# Patient Record
Sex: Female | Born: 1960 | Race: White | Hispanic: No | Marital: Married
Health system: Southern US, Community
[De-identification: ages and names within clinical notes are randomized; demographics above are authoritative.]

## PROBLEM LIST (undated history)

## (undated) DIAGNOSIS — E785 Hyperlipidemia, unspecified: Secondary | ICD-10-CM

## (undated) DIAGNOSIS — I341 Nonrheumatic mitral (valve) prolapse: Secondary | ICD-10-CM

---

## 2004-12-14 ENCOUNTER — Emergency Department: Payer: Self-pay | Admitting: Emergency Medicine

## 2005-01-03 ENCOUNTER — Emergency Department: Payer: Self-pay | Admitting: Emergency Medicine

## 2006-02-20 ENCOUNTER — Emergency Department: Payer: Self-pay | Admitting: Emergency Medicine

## 2007-07-10 ENCOUNTER — Emergency Department: Payer: Self-pay | Admitting: Emergency Medicine

## 2009-04-09 ENCOUNTER — Emergency Department: Payer: Self-pay | Admitting: Emergency Medicine

## 2010-03-11 ENCOUNTER — Ambulatory Visit: Payer: Self-pay | Admitting: Unknown Physician Specialty

## 2010-03-16 ENCOUNTER — Ambulatory Visit: Payer: Self-pay | Admitting: Unknown Physician Specialty

## 2010-03-22 ENCOUNTER — Ambulatory Visit: Payer: Self-pay | Admitting: Unknown Physician Specialty

## 2010-03-24 ENCOUNTER — Ambulatory Visit: Payer: Self-pay | Admitting: Unknown Physician Specialty

## 2011-04-01 IMAGING — CT CT ABDOMEN AND PELVIS WITHOUT AND WITH CONTRAST
1 of 4 series · 12 of 32 positions shown, 18 images · IV contrast (agent unspecified)
Comparison: none

REASON FOR EXAM: LLQ abd pain  eval diverticulosis or stone
COMMENTS:

PROCEDURE:     CT  - CT ABDOMEN / PELVIS  W/WO  - March 16, 2010  [DATE]
RESULT:
TECHNIQUE: CT of the abdomen and pelvis is performed. Noncontrast images and
images obtained following intravenous administration of 100 ml of Esovue-C58
iodinated intravenous contrast are obtained. Only limited delayed post
contrast images were obtained over the abdominal region. This may have been
secondary to technical oversight.

[Series 3: with · axial · 0.79mm/px · z∈[-448,-38]mm · 12 of 98 slices shown, 18 images]
[im 8/98  soft-tissue]
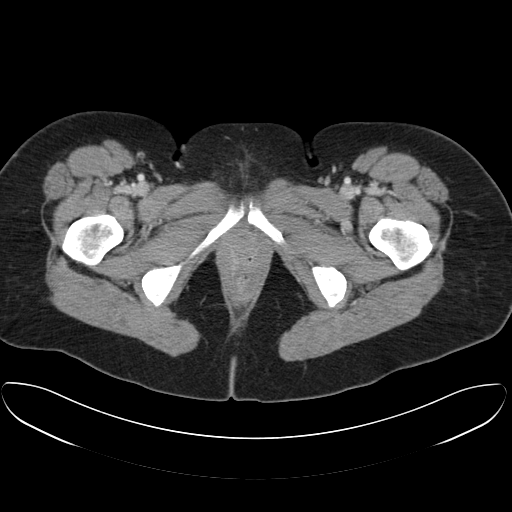
[im 8/98  bone]
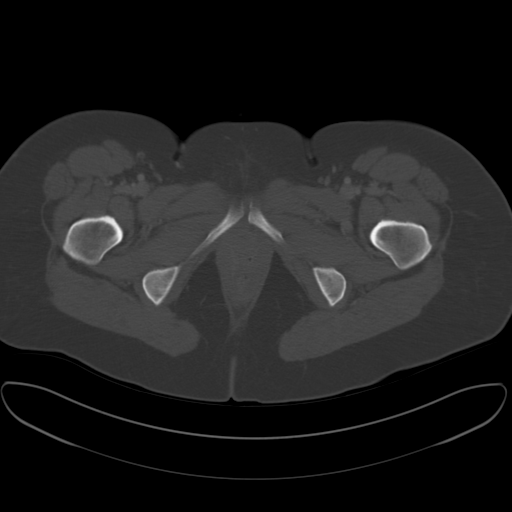
[im 15/98  soft-tissue]
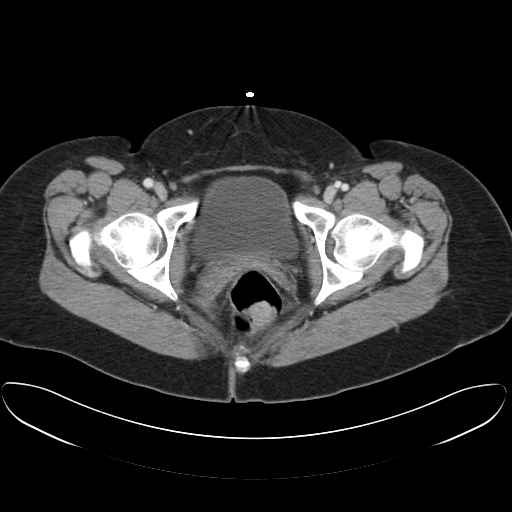
[im 23/98  soft-tissue]
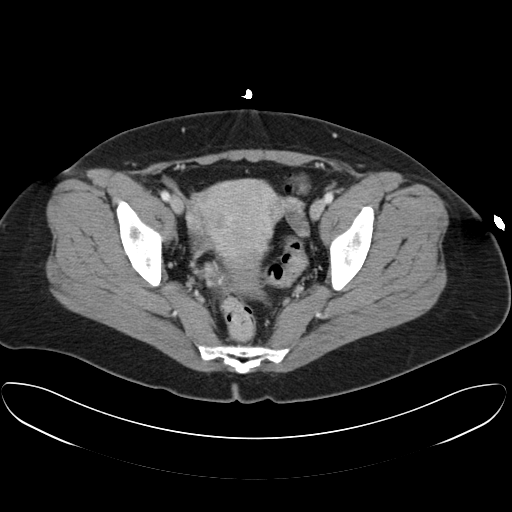
[im 30/98  soft-tissue]
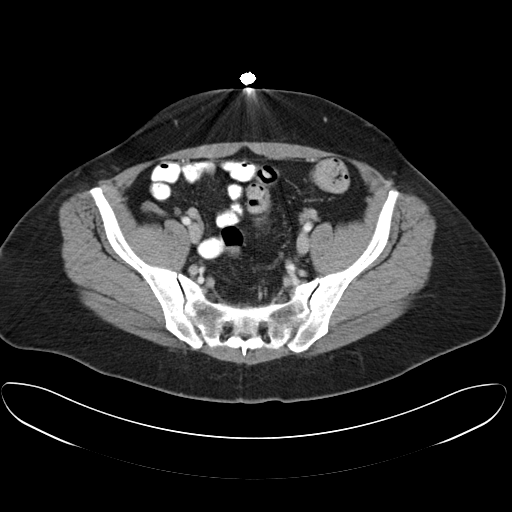
[im 38/98  soft-tissue]
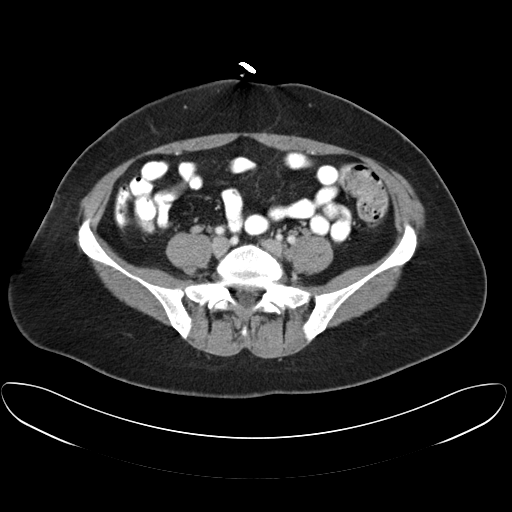
[im 45/98  soft-tissue]
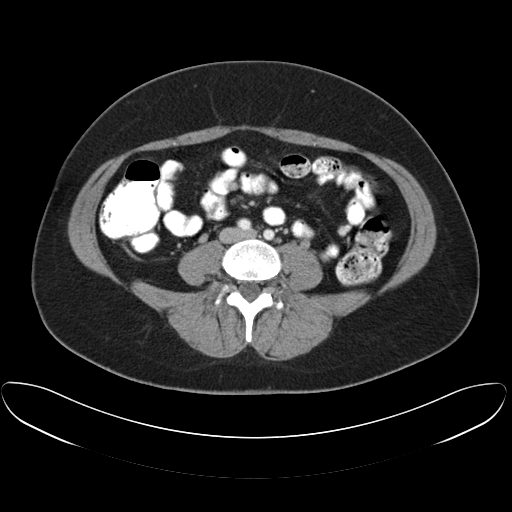
[im 53/98  soft-tissue]
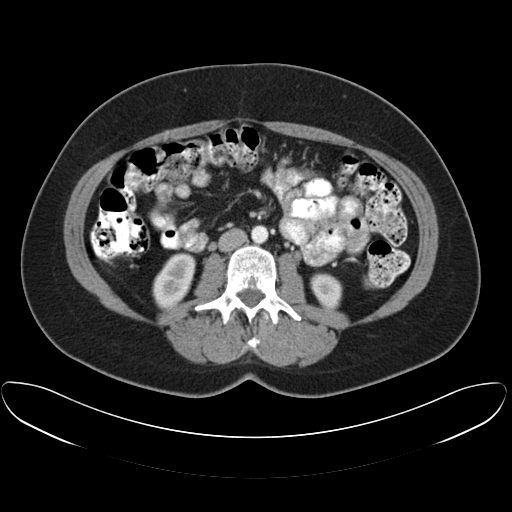
[im 60/98  soft-tissue]
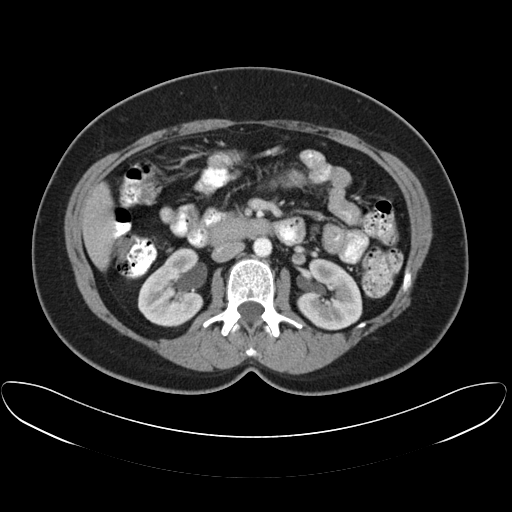
[im 68/98  soft-tissue]
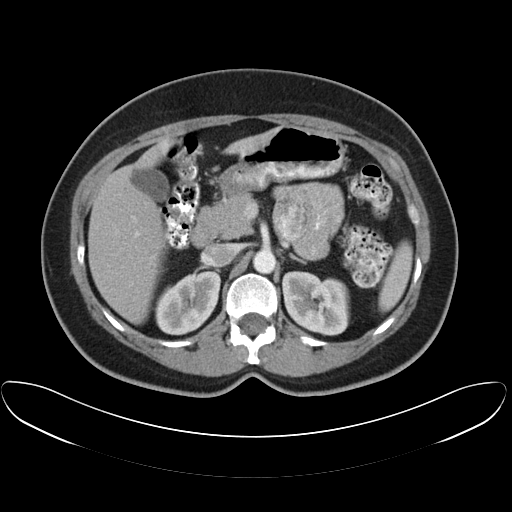
[im 68/98  lung]
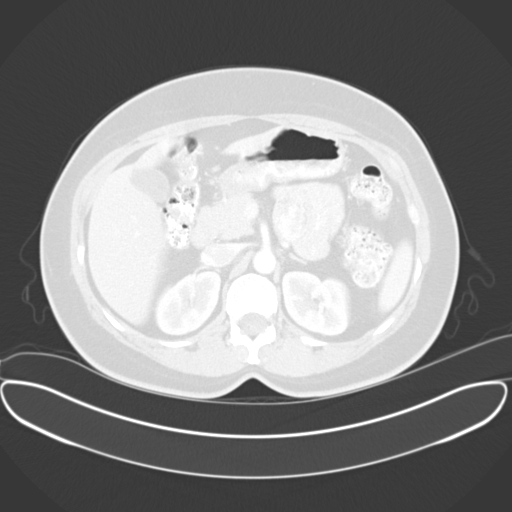
[im 68/98  bone]
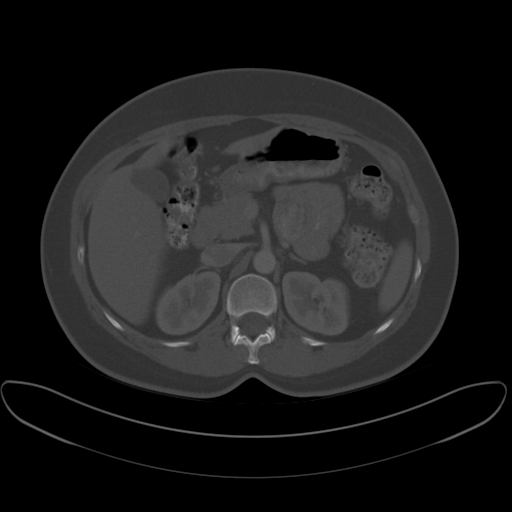
[im 75/98  soft-tissue]
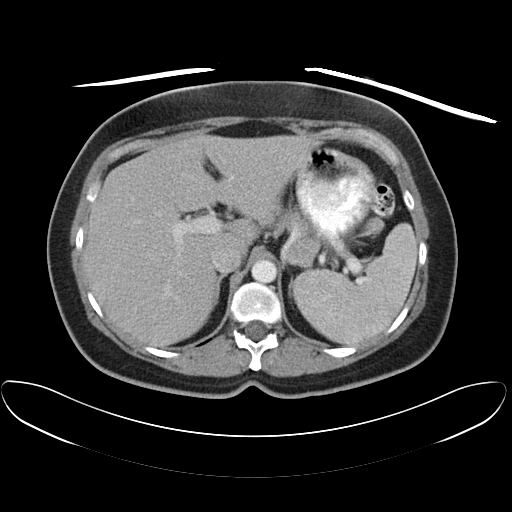
[im 75/98  lung]
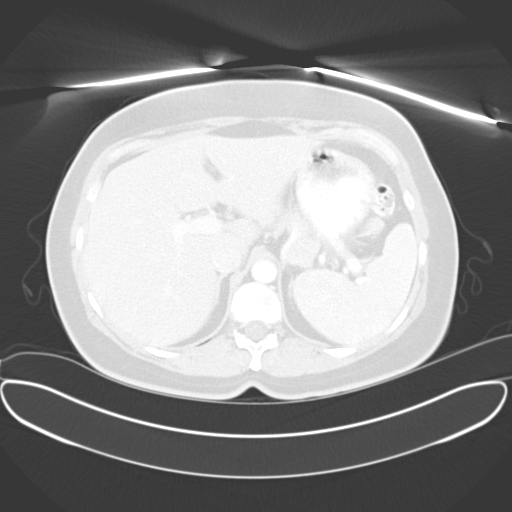
[im 83/98  soft-tissue]
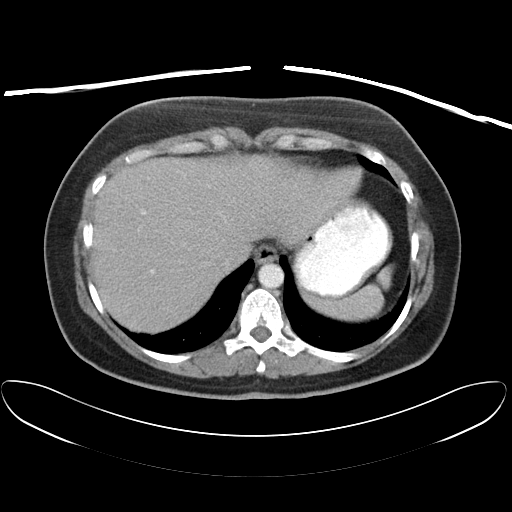
[im 83/98  lung]
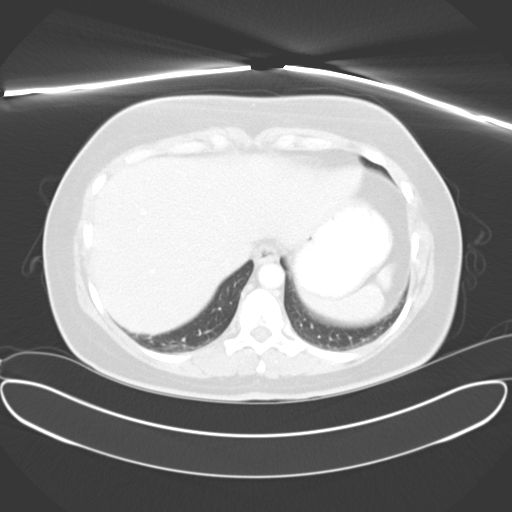
[im 90/98  soft-tissue]
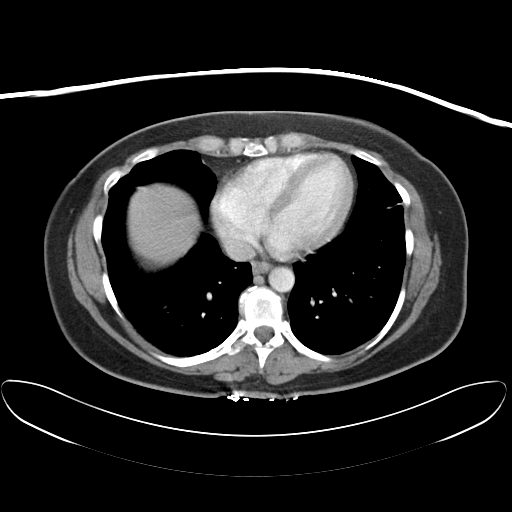
[im 90/98  lung]
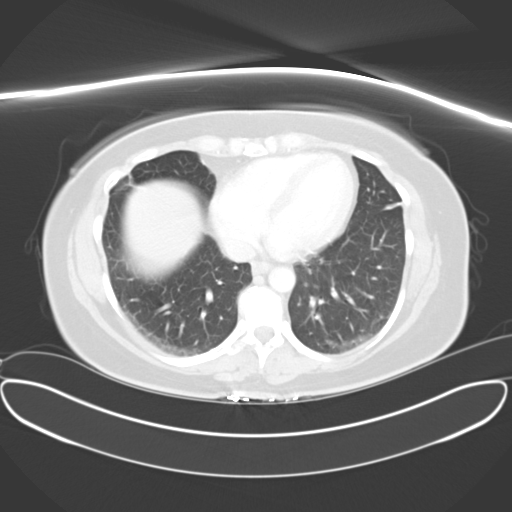

[12 of 32 positions shown; findings below may reference images not displayed]

FINDINGS: Dependent atelectasis is present in the lung bases. There is some
atelectasis in the right middle lobe and lingula. Linear fibrosis could give
a similar pattern. There is no underlying mass, edema, infiltrate or
effusion.

Noncontrast images show some scattered colonic diverticula in the sigmoid
region. No radiopaque gallstones are demonstrated. There is a nonobstructing
calculus or tiny parenchymal calcification in the right kidney on image #36.

Following intravenous administration of iodinated contrast, there appears to
be relatively homogeneous enhancement of the uterus with a few small areas
of decreased enhancement on image #74 which could be artifactual or which
could represent some small leiomyomas. The appendix is normal. There is no
acute inflammation. There is no free fluid or abnormal fluid collection. The
aorta and iliac arteries appear unremarkable. The kidneys, liver, spleen,
adrenal glands and pancreas are within normal limits. The bony structures
are unremarkable.
IMPRESSION: 1.     Minimal diverticulosis. No evidence of acute diverticulitis.
2.     No urinary obstructive changes are present. There is a tiny,
nonobstructing calculus possibly in the right kidney on image #36.

## 2012-12-16 ENCOUNTER — Emergency Department: Payer: Self-pay | Admitting: Emergency Medicine

## 2012-12-16 LAB — CBC
HCT: 41.1 % (ref 35.0–47.0)
MCH: 31.1 pg (ref 26.0–34.0)
MCHC: 33.8 g/dL (ref 32.0–36.0)
MCV: 92 fL (ref 80–100)
Platelet: 280 10*3/uL (ref 150–440)
RBC: 4.46 10*6/uL (ref 3.80–5.20)
RDW: 13 % (ref 11.5–14.5)
WBC: 9 10*3/uL (ref 3.6–11.0)

## 2012-12-16 LAB — URINALYSIS, COMPLETE
Glucose,UR: NEGATIVE mg/dL (ref 0–75)
Hyaline Cast: 4
Nitrite: NEGATIVE
Ph: 5 (ref 4.5–8.0)
Protein: 100
Specific Gravity: 1.025 (ref 1.003–1.030)
WBC UR: 10 /HPF (ref 0–5)

## 2012-12-16 LAB — COMPREHENSIVE METABOLIC PANEL
Albumin: 4 g/dL (ref 3.4–5.0)
BUN: 19 mg/dL — ABNORMAL HIGH (ref 7–18)
Calcium, Total: 8.9 mg/dL (ref 8.5–10.1)
Chloride: 110 mmol/L — ABNORMAL HIGH (ref 98–107)
Co2: 26 mmol/L (ref 21–32)
Creatinine: 0.87 mg/dL (ref 0.60–1.30)
EGFR (African American): >60
EGFR (Non-African Amer.): >60
Glucose: 119 mg/dL — ABNORMAL HIGH (ref 65–99)
Potassium: 3.1 mmol/L — ABNORMAL LOW (ref 3.5–5.1)
SGOT(AST): 27 U/L (ref 15–37)
SGPT (ALT): 24 U/L (ref 12–78)
Sodium: 144 mmol/L (ref 136–145)

## 2013-01-11 ENCOUNTER — Ambulatory Visit: Payer: Self-pay | Admitting: Gastroenterology

## 2013-01-16 LAB — PATHOLOGY REPORT

## 2014-03-28 ENCOUNTER — Ambulatory Visit: Payer: Self-pay | Admitting: Gastroenterology

## 2014-04-02 LAB — PATHOLOGY REPORT

## 2015-06-17 ENCOUNTER — Other Ambulatory Visit: Payer: Self-pay | Admitting: Family Medicine

## 2015-06-17 DIAGNOSIS — N390 Urinary tract infection, site not specified: Secondary | ICD-10-CM

## 2015-06-17 DIAGNOSIS — R319 Hematuria, unspecified: Principal | ICD-10-CM

## 2015-06-25 ENCOUNTER — Ambulatory Visit
Admission: RE | Admit: 2015-06-25 | Discharge: 2015-06-25 | Disposition: A | Discharge status: Home / Self Care | Payer: Managed Care, Other (non HMO) | Source: Ambulatory Visit | Attending: Family Medicine | Admitting: Family Medicine

## 2015-06-25 ENCOUNTER — Telehealth (HOSPITAL_COMMUNITY): Payer: Self-pay | Admitting: Radiology

## 2015-06-25 NOTE — Telephone Encounter (Deleted)
Laurey Arrow left msg for the patient advising this appt has been rescheduled to July 14 at 10am.

## 2015-06-25 NOTE — Telephone Encounter (Signed)
Laurey Arrow left msg for the patient advising this appt has been rescheduled to July 14 at 10am.

## 2015-07-02 ENCOUNTER — Ambulatory Visit
Admission: RE | Admit: 2015-07-02 | Discharge: 2015-07-02 | Disposition: A | Discharge status: Home / Self Care | Payer: Managed Care, Other (non HMO) | Source: Ambulatory Visit | Attending: Family Medicine | Admitting: Family Medicine

## 2015-07-02 DIAGNOSIS — R319 Hematuria, unspecified: Secondary | ICD-10-CM | POA: Diagnosis present

## 2015-07-02 DIAGNOSIS — N390 Urinary tract infection, site not specified: Secondary | ICD-10-CM

## 2015-07-02 DIAGNOSIS — K5730 Diverticulosis of large intestine without perforation or abscess without bleeding: Secondary | ICD-10-CM | POA: Diagnosis not present

## 2015-07-02 DIAGNOSIS — D259 Leiomyoma of uterus, unspecified: Secondary | ICD-10-CM | POA: Diagnosis not present

## 2015-07-02 HISTORY — DX: Nonrheumatic mitral (valve) prolapse: I34.1

## 2015-07-02 HISTORY — DX: Hyperlipidemia, unspecified: E78.5

## 2015-07-02 MED ORDER — IOHEXOL 300 MG/ML  SOLN
125.0000 mL | Freq: Once | INTRAMUSCULAR | Status: AC | PRN
  Administered 2015-07-02: 125 mL via INTRAVENOUS

## 2016-07-17 IMAGING — CT CT ABD-PEL WO/W CM
2 of 10 series · 10 of 46 positions shown, 16 images · IV contrast (omnipaque)
Comparison: CT of the abdomen and pelvis 03/16/2010.

CLINICAL DATA: 53-year-old female with history of microscopic
hematuria found incidentally during routine yearly physical
examination. Currently asymptomatic. No history of kidney stones.

EXAM:
CT ABDOMEN AND PELVIS WITHOUT AND WITH CONTRAST
TECHNIQUE: Multidetector CT imaging of the abdomen and pelvis was performed
following the standard protocol before and following the bolus
administration of intravenous contrast.
CONTRAST:  125mL OMNIPAQUE IOHEXOL 300 MG/ML  SOLN

[Series 4: hematuria > 45 with · axial · 0.73mm/px · z∈[-925,-560]mm · 8 of 95 slices shown, 13 images]
[im 11/95  soft-tissue]
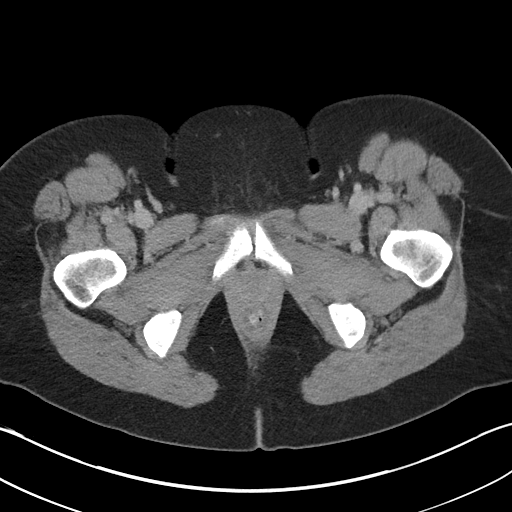
[im 11/95  bone]
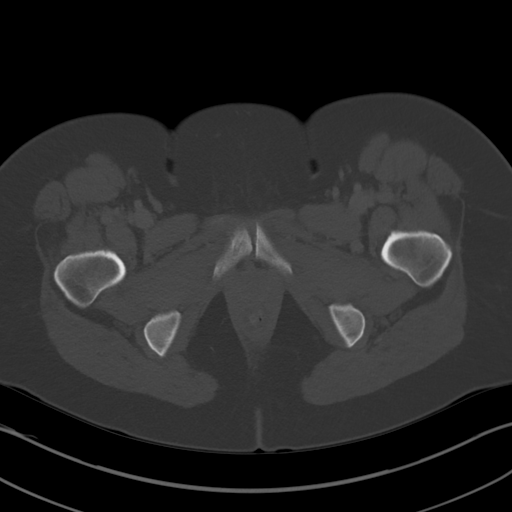
[im 21/95  soft-tissue]
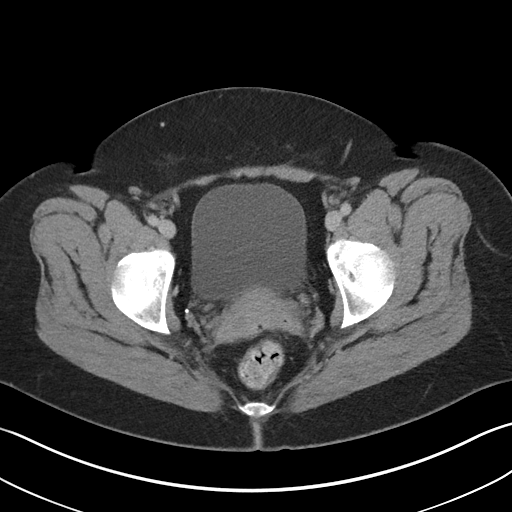
[im 32/95  soft-tissue]
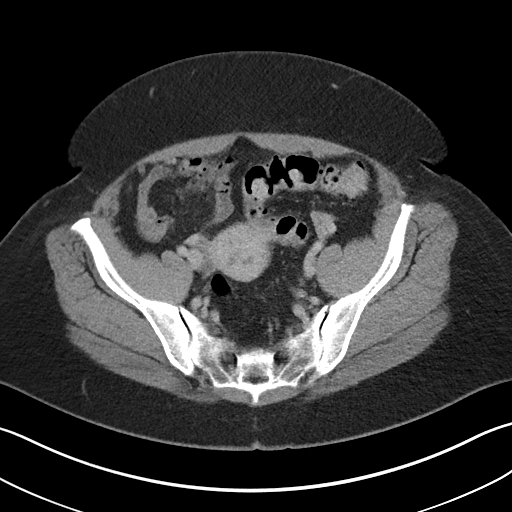
[im 42/95  soft-tissue]
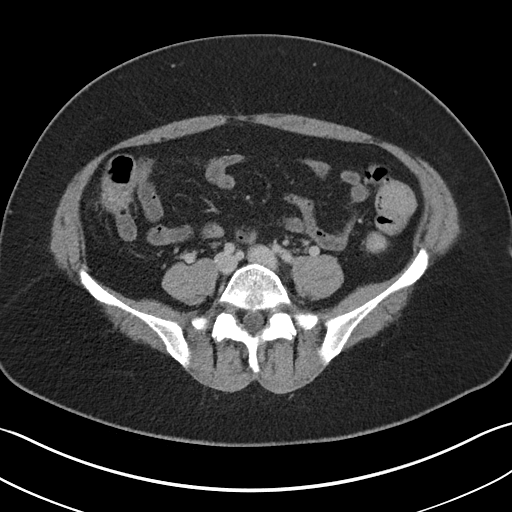
[im 53/95  soft-tissue]
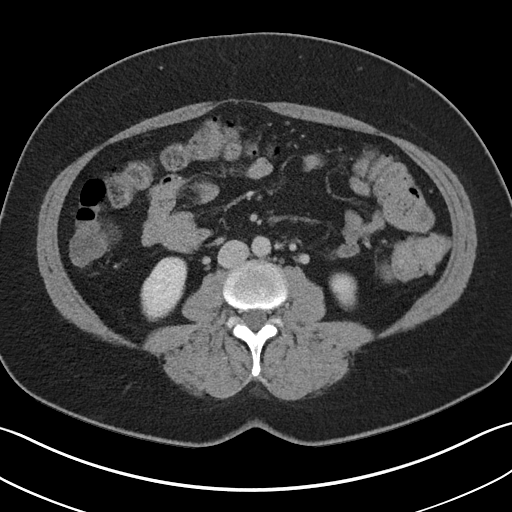
[im 53/95  lung]
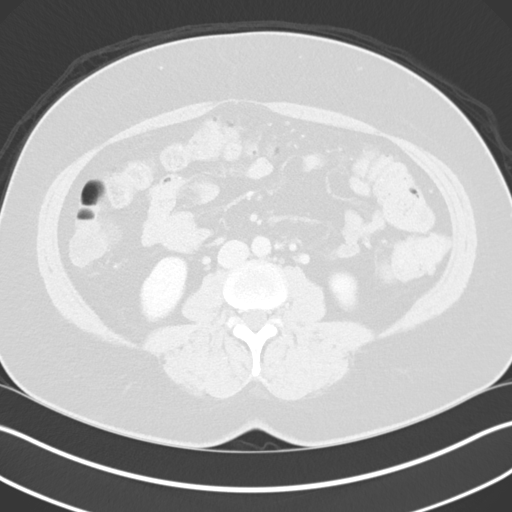
[im 63/95  soft-tissue]
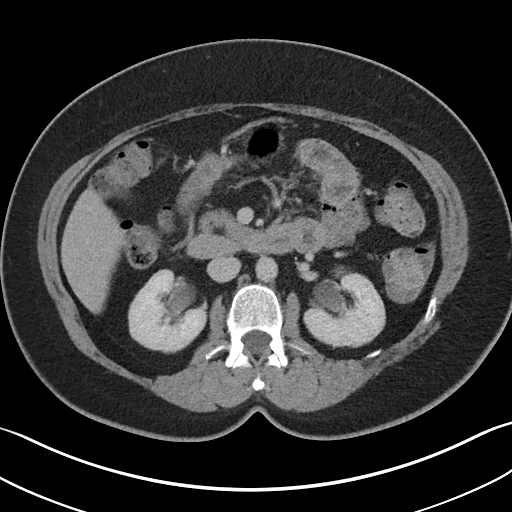
[im 63/95  lung]
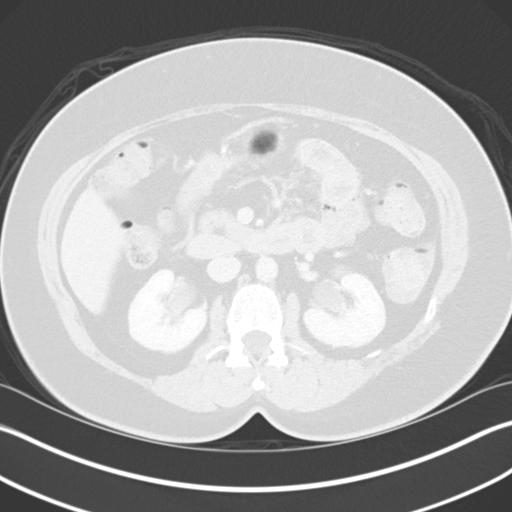
[im 74/95  soft-tissue]
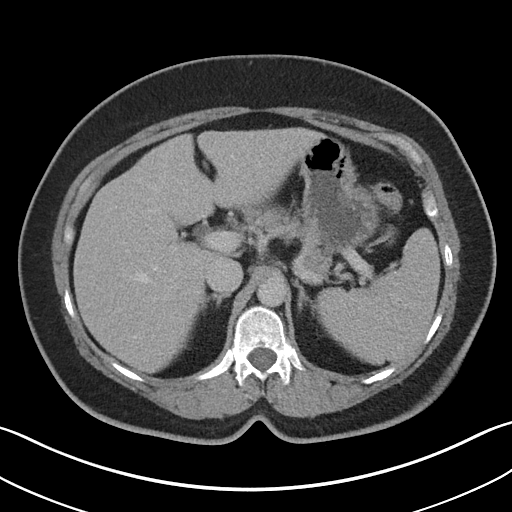
[im 74/95  lung]
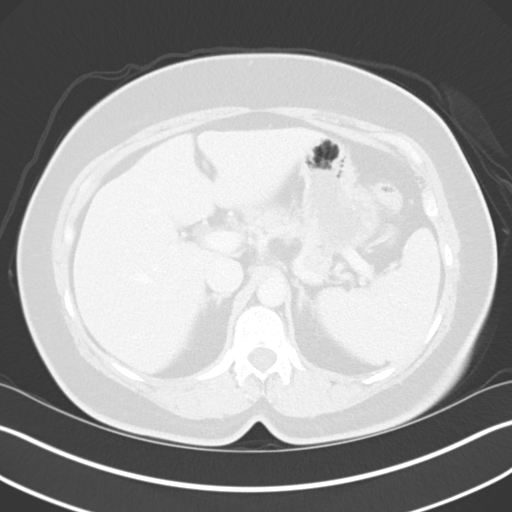
[im 84/95  soft-tissue]
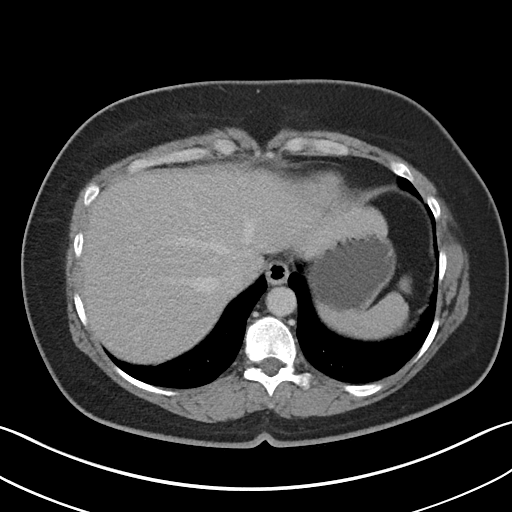
[im 84/95  lung]
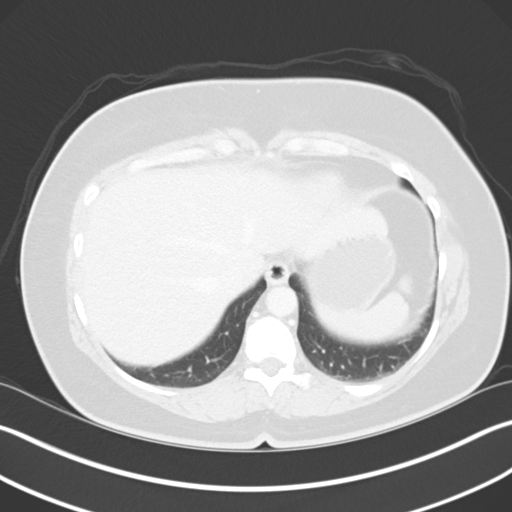

[Series 7: cor hematuria > 45 wo · coronal · 0.75mm/px · 2 of 161 slices shown, 3 images]
[im 54/161  soft-tissue]
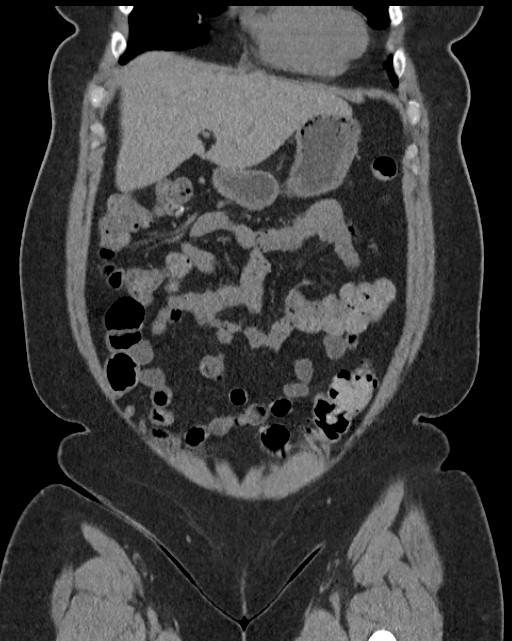
[im 54/161  bone]
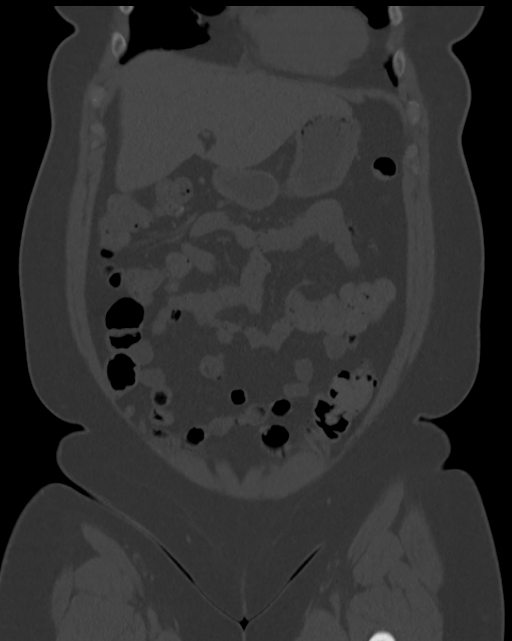
[im 107/161  soft-tissue]
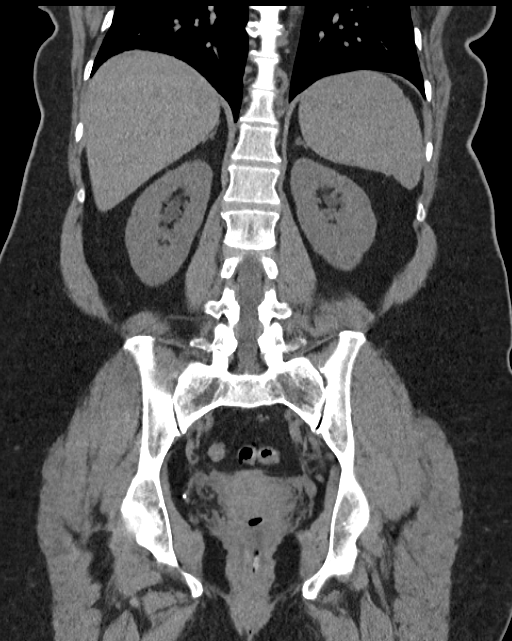

[10 of 46 positions shown; findings below may reference images not displayed]

FINDINGS: Lower chest: Linear scarring in the inferior segment of the lingula
and in the right middle lobe.

Hepatobiliary: No cystic or solid hepatic lesions. No intra or
extrahepatic biliary ductal dilatation. Gallbladder is normal in
appearance.

Pancreas: No pancreatic mass. No pancreatic ductal dilatation. No
pancreatic or peripancreatic fluid or inflammatory changes.

Spleen: Unremarkable.

Adrenals/Urinary Tract: Precontrast images demonstrate no
calcifications within the collecting system of either kidney, along
the course of either ureter, or within the lumen of the urinary
bladder. Post contrast images demonstrate no focal solid renal
lesions. Post contrast delayed images demonstrate no definite
filling defects within the collecting system of either kidney, along
the course of either ureter, or within the lumen of the urinary
bladder to strongly suggest the presence of a urothelial neoplasm.
Urinary bladder is normal in appearance. Bilateral adrenal glands
are normal in appearance.

Stomach/Bowel: Normal appearance of the stomach. No pathologic
dilatation of small bowel or colon. Numerous colonic diverticulae
are noted, without surrounding inflammatory changes to suggest an
acute diverticulitis at this time. Normal appendix.

Vascular/Lymphatic: Atherosclerosis throughout the abdominal and
pelvic vasculature, without evidence of aneurysm or dissection. No
lymphadenopathy noted in the abdomen or pelvis.

Reproductive: Mild enlargement and heterogeneous throughout the
uterus, suggestive of a fibroid uterus. Multiple small lesions are
noted throughout the uterus, largest of which measures up to 1.4 cm
in the right side of the uterine fundus. Ovaries are normal in
appearance.

Other: No significant volume of ascites.  No pneumoperitoneum.

Musculoskeletal: There are no aggressive appearing lytic or blastic
lesions noted in the visualized portions of the skeleton.
IMPRESSION: 1. No findings to account for the patient's microhematuria.
2. Fibroid uterus.
3. Normal appendix.
4. Mild colonic diverticulosis without evidence of acute
diverticulitis at this time.

## 2020-06-29 ENCOUNTER — Ambulatory Visit (INDEPENDENT_AMBULATORY_CARE_PROVIDER_SITE_OTHER): Payer: No Typology Code available for payment source | Admitting: Dermatology

## 2020-06-29 ENCOUNTER — Other Ambulatory Visit: Payer: Self-pay

## 2020-06-29 DIAGNOSIS — L821 Other seborrheic keratosis: Secondary | ICD-10-CM

## 2020-06-29 DIAGNOSIS — D1800 Hemangioma unspecified site: Secondary | ICD-10-CM

## 2020-06-29 DIAGNOSIS — L814 Other melanin hyperpigmentation: Secondary | ICD-10-CM

## 2020-06-29 DIAGNOSIS — L820 Inflamed seborrheic keratosis: Secondary | ICD-10-CM | POA: Diagnosis not present

## 2020-06-29 DIAGNOSIS — L858 Other specified epidermal thickening: Secondary | ICD-10-CM | POA: Diagnosis not present

## 2020-06-29 DIAGNOSIS — L578 Other skin changes due to chronic exposure to nonionizing radiation: Secondary | ICD-10-CM

## 2020-06-29 DIAGNOSIS — Z1283 Encounter for screening for malignant neoplasm of skin: Secondary | ICD-10-CM | POA: Diagnosis not present

## 2020-06-29 DIAGNOSIS — D229 Melanocytic nevi, unspecified: Secondary | ICD-10-CM

## 2020-06-29 NOTE — Patient Instructions (Signed)

## 2020-06-29 NOTE — Progress Notes (Signed)
   Follow-Up Visit   Subjective  Connie Howell is a 59 y.o. female who presents for the following: Annual Exam (No history of skin cancer or abnormal moles. UBSE today) and Other (Spots on left nose and right cheek that are irritating.). The patient presents for Upper Body Skin Exam (UBSE) for skin cancer screening and mole check.  The following portions of the chart were reviewed this encounter and updated as appropriate:  Allergies  Meds  Problems  Med Hx  Surg Hx  Fam Hx     Review of Systems:  No other skin or systemic complaints except as noted in HPI or Assessment and Plan.  Objective  Well appearing patient in no apparent distress; mood and affect are within normal limits.  All skin waist up examined.  Objective  left nose x 2, right zygoma x 1, left cheek x 1 (4): Erythematous keratotic or waxy stuck-on papule or plaque.   Objective  Bilateral tricep areas: Rough follicular plugging.   Assessment & Plan    Lentigines - Scattered tan macules - Discussed due to sun exposure - Benign, observe - Call for any changes  Seborrheic Keratoses - Stuck-on, waxy, tan-brown papules and plaques  - Discussed benign etiology and prognosis. - Observe - Call for any changes  Melanocytic Nevi - Tan-brown and/or pink-flesh-colored symmetric macules and papules - Benign appearing on exam today - Observation - Call clinic for new or changing moles - Recommend daily use of broad spectrum spf 30+ sunscreen to sun-exposed areas.   Hemangiomas - Red papules - Discussed benign nature - Observe - Call for any changes  Actinic Damage - diffuse scaly erythematous macules with underlying dyspigmentation - Recommend daily broad spectrum sunscreen SPF 30+ to sun-exposed areas, reapply every 2 hours as needed.  - Call for new or changing lesions.  Skin cancer screening performed today.   Inflamed seborrheic keratosis (4) left nose x 2, right zygoma x 1, left cheek x  1  RTC if not resolved after 6 weeks.  Destruction of lesion - left nose x 2, right zygoma x 1, left cheek x 1 Complexity: simple   Destruction method: cryotherapy   Informed consent: discussed and consent obtained   Timeout:  patient name, date of birth, surgical site, and procedure verified Lesion destroyed using liquid nitrogen: Yes   Region frozen until ice ball extended beyond lesion: Yes   Outcome: patient tolerated procedure well with no complications   Post-procedure details: wound care instructions given    Keratosis pilaris Bilateral tricep areas  Benign. Observe   Skin cancer screening  Return in about 1 year (around 06/29/2021).  I, Ashok Cordia, CMA, am acting as scribe for Sarina Ser, MD .  Documentation: I have reviewed the above documentation for accuracy and completeness, and I agree with the above.  Sarina Ser, MD

## 2020-06-30 ENCOUNTER — Encounter: Payer: Self-pay | Admitting: Dermatology

## 2021-07-05 ENCOUNTER — Ambulatory Visit (INDEPENDENT_AMBULATORY_CARE_PROVIDER_SITE_OTHER): Payer: No Typology Code available for payment source | Admitting: Dermatology

## 2021-07-05 ENCOUNTER — Other Ambulatory Visit: Payer: Self-pay

## 2021-07-05 DIAGNOSIS — Z1283 Encounter for screening for malignant neoplasm of skin: Secondary | ICD-10-CM

## 2021-07-05 DIAGNOSIS — L821 Other seborrheic keratosis: Secondary | ICD-10-CM

## 2021-07-05 DIAGNOSIS — D229 Melanocytic nevi, unspecified: Secondary | ICD-10-CM

## 2021-07-05 DIAGNOSIS — L578 Other skin changes due to chronic exposure to nonionizing radiation: Secondary | ICD-10-CM

## 2021-07-05 DIAGNOSIS — D235 Other benign neoplasm of skin of trunk: Secondary | ICD-10-CM

## 2021-07-05 DIAGNOSIS — D225 Melanocytic nevi of trunk: Secondary | ICD-10-CM | POA: Diagnosis not present

## 2021-07-05 DIAGNOSIS — Q180 Sinus, fistula and cyst of branchial cleft: Secondary | ICD-10-CM

## 2021-07-05 DIAGNOSIS — L820 Inflamed seborrheic keratosis: Secondary | ICD-10-CM | POA: Diagnosis not present

## 2021-07-05 DIAGNOSIS — D492 Neoplasm of unspecified behavior of bone, soft tissue, and skin: Secondary | ICD-10-CM

## 2021-07-05 DIAGNOSIS — D1800 Hemangioma unspecified site: Secondary | ICD-10-CM

## 2021-07-05 DIAGNOSIS — L814 Other melanin hyperpigmentation: Secondary | ICD-10-CM

## 2021-07-05 DIAGNOSIS — D239 Other benign neoplasm of skin, unspecified: Secondary | ICD-10-CM

## 2021-07-05 HISTORY — DX: Melanocytic nevi, unspecified: D22.9

## 2021-07-05 NOTE — Patient Instructions (Addendum)
If you have any questions or concerns for your doctor, please call our main line at [phone] and press option 4 to reach your doctor's medical assistant. If no one answers, please leave a voicemail as directed and we will return your call as soon as possible. Messages left after 4 pm will be answered the following business day.   You may also send us a message via MyChart. We typically respond to MyChart messages within 1-2 business days.  For prescription refills, please ask your pharmacy to contact our office. Our fax number is [phone].  If you have an urgent issue when the clinic is closed that cannot wait until the next business day, you can page your doctor at the number below.    Please note that while we do our best to be available for urgent issues outside of office hours, we are not available 24/7.   If you have an urgent issue and are unable to reach us, you may choose to seek medical care at your doctor's office, retail clinic, urgent care center, or emergency room.  If you have a medical emergency, please immediately call 911 or go to the emergency department.  Pager Numbers  - Dr. Kowalski: [phone]  - Dr. Moye: [phone]  - Dr. Stewart: [phone]  In the event of inclement weather, please call our main line at [phone] for an update on the status of any delays or closures.  Dermatology Medication Tips: Please keep the boxes that topical medications come in in order to help keep track of the instructions about where and how to use these. Pharmacies typically print the medication instructions only on the boxes and not directly on the medication tubes.   If your medication is too expensive, please contact our office at [phone] option 4 or send us a message through MyChart.   We are unable to tell what your co-pay for medications will be in advance as this is different depending on your insurance coverage. However, we may be able to find a substitute  medication at lower cost or fill out paperwork to get insurance to cover a needed medication.   If a prior authorization is required to get your medication covered by your insurance company, please allow us 1-2 business days to complete this process.  Drug prices often vary depending on where the prescription is filled and some pharmacies may offer cheaper prices.  The website www.goodrx.com contains coupons for medications through different pharmacies. The prices here do not account for what the cost may be with help from insurance (it may be cheaper with your insurance), but the website can give you the price if you did not use any insurance.  - You can print the associated coupon and take it with your prescription to the pharmacy.  - You may also stop by our office during regular business hours and pick up a GoodRx coupon card.  - If you need your prescription sent electronically to a different pharmacy, notify our office through [facility] MyChart or by phone at [phone] option 4.   Wound Care Instructions  Cleanse wound gently with soap and water once a day then pat dry with clean gauze. Apply a thing coat of Petrolatum (petroleum jelly, "Vaseline") over the wound (unless you have an allergy to this). We recommend that you use a new, sterile tube of Vaseline. Do not pick or remove scabs. Do not remove the yellow or white "healing tissue" from the base of the wound.  Cover the   wound with fresh, clean, nonstick gauze and secure with paper tape. You may use Band-Aids in place of gauze and tape if the would is small enough, but would recommend trimming much of the tape off as there is often too much. Sometimes Band-Aids can irritate the skin.  You should call the office for your biopsy report after 1 week if you have not already been contacted.  If you experience any problems, such as abnormal amounts of bleeding, swelling, significant bruising, significant pain, or evidence of infection,  please call the office immediately.  FOR ADULT SURGERY PATIENTS: If you need something for pain relief you may take 1 extra strength Tylenol (acetaminophen) AND 2 Ibuprofen (200mg each) together every 4 hours as needed for pain. (do not take these if you are allergic to them or if you have a reason you should not take them.) Typically, you may only need pain medication for 1 to 3 days.    

## 2021-07-05 NOTE — Progress Notes (Signed)
Follow-Up Visit   Subjective  Connie Howell is a 60 y.o. female who presents for the following: Total body skin exam (No new or changing areas). The patient presents for Total-Body Skin Exam (TBSE) for skin cancer screening and mole check.  The following portions of the chart were reviewed this encounter and updated as appropriate:   Allergies  Meds  Problems  Med Hx  Surg Hx  Fam Hx     Review of Systems:  No other skin or systemic complaints except as noted in HPI or Assessment and Plan.  Objective  Well appearing patient in no apparent distress; mood and affect are within normal limits.  A full examination was performed including scalp, head, eyes, ears, nose, lips, neck, chest, axillae, abdomen, back, buttocks, bilateral upper extremities, bilateral lower extremities, hands, feet, fingers, toes, fingernails, and toenails. All findings within normal limits unless otherwise noted below.  Left Upper Arm x 2, Total = 2 (2) Erythematous keratotic or waxy stuck-on papule or plaque.   Left Upper Back Firm pink/brown papulenodule with dimple sign.   L mid to low back, 7.0cm lat to spine Irregular brown macule 0.4cm  R ant neck Cystic pap   Assessment & Plan   Lentigines - Scattered tan macules - Due to sun exposure - Benign-appering, observe - Recommend daily broad spectrum sunscreen SPF 30+ to sun-exposed areas, reapply every 2 hours as needed. - Call for any changes  Seborrheic Keratoses - Stuck-on, waxy, tan-brown papules and/or plaques  - Benign-appearing - Discussed benign etiology and prognosis. - Observe - Call for any changes  Melanocytic Nevi - Tan-brown and/or pink-flesh-colored symmetric macules and papules - Benign appearing on exam today - Observation - Call clinic for new or changing moles - Recommend daily use of broad spectrum spf 30+ sunscreen to sun-exposed areas.   Hemangiomas - Red papules - Discussed benign nature - Observe - Call  for any changes  Actinic Damage - Chronic condition, secondary to cumulative UV/sun exposure - diffuse scaly erythematous macules with underlying dyspigmentation - Recommend daily broad spectrum sunscreen SPF 30+ to sun-exposed areas, reapply every 2 hours as needed.  - Staying in the shade or wearing long sleeves, sun glasses (UVA+UVB protection) and wide brim hats (4-inch brim around the entire circumference of the hat) are also recommended for sun protection.  - Call for new or changing lesions.  Skin cancer screening performed today.  Inflamed seborrheic keratosis Left Upper Arm x 2, Total = 2 Destruction of lesion - Left Upper Arm x 2, Total = 2 Complexity: simple   Destruction method: cryotherapy   Informed consent: discussed and consent obtained   Timeout:  patient name, date of birth, surgical site, and procedure verified Lesion destroyed using liquid nitrogen: Yes   Region frozen until ice ball extended beyond lesion: Yes   Outcome: patient tolerated procedure well with no complications   Post-procedure details: wound care instructions given    Dermatofibroma Left Upper Back Benign appearing, observe  Neoplasm of skin L mid to low back, 7.0cm lat to spine Epidermal / dermal shaving  Lesion diameter (cm):  0.4 Informed consent: discussed and consent obtained   Timeout: patient name, date of birth, surgical site, and procedure verified   Procedure prep:  Patient was prepped and draped in usual sterile fashion Prep type:  Isopropyl alcohol Anesthesia: the lesion was anesthetized in a standard fashion   Anesthetic:  1% lidocaine w/ epinephrine 1-100,000 buffered w/ 8.4% NaHCO3 Instrument used: flexible razor  blade   Hemostasis achieved with: pressure, aluminum chloride and electrodesiccation   Outcome: patient tolerated procedure well   Post-procedure details: sterile dressing applied and wound care instructions given   Dressing type: bandage and petrolatum     Specimen 1 - Surgical pathology Differential Diagnosis: D48.5 Nevus vs Dysplastic Nevus  Check Margins: yes Irregular brown macule 0.4cm  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  I, Connie Howell, RMA, am acting as scribe for Connie Ser, MD . Documentation: I have reviewed the above documentation for accuracy and completeness, and I agree with the above.  Connie Ser, MD

## 2021-07-07 ENCOUNTER — Encounter: Payer: Self-pay | Admitting: Dermatology

## 2021-07-15 ENCOUNTER — Telehealth: Payer: Self-pay

## 2021-07-15 ENCOUNTER — Encounter: Payer: Self-pay | Admitting: Dermatology

## 2021-07-15 NOTE — Telephone Encounter (Signed)
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

## 2021-07-15 NOTE — Telephone Encounter (Signed)
Left pt msg to call for bx results/sh 

## 2021-07-20 ENCOUNTER — Other Ambulatory Visit: Payer: Self-pay

## 2021-07-20 ENCOUNTER — Encounter: Payer: Self-pay | Admitting: Dermatology

## 2021-07-20 ENCOUNTER — Ambulatory Visit (INDEPENDENT_AMBULATORY_CARE_PROVIDER_SITE_OTHER): Payer: No Typology Code available for payment source | Admitting: Dermatology

## 2021-07-20 DIAGNOSIS — D485 Neoplasm of uncertain behavior of skin: Secondary | ICD-10-CM

## 2021-07-20 DIAGNOSIS — D492 Neoplasm of unspecified behavior of bone, soft tissue, and skin: Secondary | ICD-10-CM

## 2021-07-20 MED ORDER — MUPIROCIN 2 % EX OINT: 1.0000 "application " | TOPICAL_OINTMENT | Freq: Every day | CUTANEOUS | 1 refills | Status: DC

## 2021-07-20 NOTE — Progress Notes (Signed)
   Follow-Up Visit   Subjective  Connie Howell is a 60 y.o. 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  The following portions of the chart were reviewed this encounter and updated as appropriate:   Allergies  Meds  Problems  Med Hx  Surg Hx  Fam Hx     Review of Systems:  No other skin or systemic complaints except as noted in HPI or Assessment and Plan.  Objective  Well appearing patient in no apparent distress; mood and affect are within normal limits.  A focused examination was performed including back. Relevant physical exam findings are noted in the Assessment and Plan.  Left mid to low back, 7.0cm lat to spine Pink bx site 0.7 x 0.5cm   Assessment & Plan  Neoplasm of skin Left mid to low back, 7.0cm lat to spine  Skin excision  Lesion length (cm):  0.7 Lesion width (cm):  0.5 Margin per side (cm):  0.2 Total excision diameter (cm):  1.1 Informed consent: discussed and consent obtained   Timeout: patient name, date of birth, surgical site, and procedure verified   Procedure prep:  Patient was prepped and draped in usual sterile fashion Prep type:  Isopropyl alcohol and povidone-iodine Anesthesia: the lesion was anesthetized in a standard fashion   Anesthetic:  1% lidocaine w/ epinephrine 1-100,000 buffered w/ 8.4% NaHCO3 (6cc) Instrument used: #15 blade   Hemostasis achieved with: pressure   Hemostasis achieved with comment:  Electrocautery Outcome: patient tolerated procedure well with no complications   Post-procedure details: sterile dressing applied and wound care instructions given   Dressing type: bandage, pressure dressing and bacitracin (Mupirocin)    Skin repair Complexity:  Complex Final length (cm):  3.5 Reason for type of repair: reduce tension to allow closure, reduce the risk of dehiscence, infection, and necrosis, reduce subcutaneous dead space and  avoid a hematoma, allow closure of the large defect, preserve normal anatomy, preserve normal anatomical and functional relationships and enhance both functionality and cosmetic results   Undermining: area extensively undermined   Undermining comment:  Undermining Defect 1.1cm Subcutaneous layers (deep stitches):  Suture size:  3-0 Suture type: Vicryl (polyglactin 910)   Subcutaneous suture technique: Inverted Dermal. Fine/surface layer approximation (top stitches):  Suture size:  3-0 Suture type: nylon   Stitches: simple running   Suture removal (days):  7 Hemostasis achieved with: pressure Outcome: patient tolerated procedure well with no complications   Post-procedure details: sterile dressing applied and wound care instructions given   Dressing type: bandage, pressure dressing and bacitracin (Mupirocin)    mupirocin ointment (BACTROBAN) 2 % Apply 1 application topically daily. 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  I, [name], RMA, am acting as scribe for Sarina Ser, MD . Documentation: I have reviewed the above documentation for accuracy and completeness, and I agree with the above.  Sarina Ser, MD

## 2021-07-20 NOTE — Patient Instructions (Signed)
Wound Care Instructions  Cleanse wound gently with soap and water once a day then pat dry with clean gauze. Apply a thing coat of Petrolatum (petroleum jelly, "Vaseline") over the wound (unless you have an allergy to this). We recommend that you use a new, sterile tube of Vaseline. Do not pick or remove scabs. Do not remove the yellow or white "healing tissue" from the base of the wound.  Cover the wound with fresh, clean, nonstick gauze and secure with paper tape. You may use Band-Aids in place of gauze and tape if the would is small enough, but would recommend trimming much of the tape off as there is often too much. Sometimes Band-Aids can irritate the skin.  You should call the office for your biopsy report after 1 week if you have not already been contacted.  If you experience any problems, such as abnormal amounts of bleeding, swelling, significant bruising, significant pain, or evidence of infection, please call the office immediately.  FOR ADULT SURGERY PATIENTS: If you need something for pain relief you may take 1 extra strength Tylenol (acetaminophen) AND 2 Ibuprofen (200mg each) together every 4 hours as needed for pain. (do not take these if you are allergic to them or if you have a reason you should not take them.) Typically, you may only need pain medication for 1 to 3 days.   If you have any questions or concerns for your doctor, please call our main line at [phone] and press option 4 to reach your doctor's medical assistant. If no one answers, please leave a voicemail as directed and we will return your call as soon as possible. Messages left after 4 pm will be answered the following business day.   You may also send us a message via MyChart. We typically respond to MyChart messages within 1-2 business days.  For prescription refills, please ask your pharmacy to contact our office. Our fax number is [phone].  If you have an urgent issue when the clinic is closed that  cannot wait until the next business day, you can page your doctor at the number below.    Please note that while we do our best to be available for urgent issues outside of office hours, we are not available 24/7.   If you have an urgent issue and are unable to reach us, you may choose to seek medical care at your doctor's office, retail clinic, urgent care center, or emergency room.  If you have a medical emergency, please immediately call 911 or go to the emergency department.  Pager Numbers  - Dr. Kowalski: [phone]  - Dr. Moye: [phone]  - Dr. Stewart: [phone]  In the event of inclement weather, please call our main line at [phone] for an update on the status of any delays or closures.  Dermatology Medication Tips: Please keep the boxes that topical medications come in in order to help keep track of the instructions about where and how to use these. Pharmacies typically print the medication instructions only on the boxes and not directly on the medication tubes.   If your medication is too expensive, please contact our office at [phone] option 4 or send us a message through MyChart.   We are unable to tell what your co-pay for medications will be in advance as this is different depending on your insurance coverage. However, we may be able to find a substitute medication at lower cost or fill out paperwork to get insurance to cover a needed   medication.   If a prior authorization is required to get your medication covered by your insurance company, please allow us 1-2 business days to complete this process.  Drug prices often vary depending on where the prescription is filled and some pharmacies may offer cheaper prices.  The website www.goodrx.com contains coupons for medications through different pharmacies. The prices here do not account for what the cost may be with help from insurance (it may be cheaper with your insurance), but the website can give you the  price if you did not use any insurance.  - You can print the associated coupon and take it with your prescription to the pharmacy.  - You may also stop by our office during regular business hours and pick up a GoodRx coupon card.  - If you need your prescription sent electronically to a different pharmacy, notify our office through [facility] MyChart or by phone at [phone] option 4.   

## 2021-07-22 ENCOUNTER — Telehealth: Payer: Self-pay

## 2021-07-22 NOTE — Telephone Encounter (Signed)
Pt doing fine after Tuesdays's surgery.Connie Howell

## 2021-07-27 ENCOUNTER — Other Ambulatory Visit: Payer: Self-pay

## 2021-07-27 ENCOUNTER — Ambulatory Visit (INDEPENDENT_AMBULATORY_CARE_PROVIDER_SITE_OTHER): Payer: No Typology Code available for payment source | Admitting: Dermatology

## 2021-07-27 DIAGNOSIS — D239 Other benign neoplasm of skin, unspecified: Secondary | ICD-10-CM

## 2021-07-27 DIAGNOSIS — D235 Other benign neoplasm of skin of trunk: Secondary | ICD-10-CM

## 2021-07-27 DIAGNOSIS — Z4802 Encounter for removal of sutures: Secondary | ICD-10-CM

## 2021-07-27 NOTE — Progress Notes (Signed)
   Follow-Up Visit   Subjective  Connie Howell is a 60 y.o. female who presents for the following: Suture / Staple Removal (Patient is here for follow up and sutures remove at left mid to low back. Patient reports area is healing and only itches. She denies any pain or other concerns. ).  The following portions of the chart were reviewed this encounter and updated as appropriate:  Allergies  Meds  Problems  Med Hx  Surg Hx  Fam Hx      Objective  Well appearing patient in no apparent distress; mood and affect are within normal limits.  A focused examination was performed including left mid to low back. Relevant physical exam findings are noted in the Assessment and Plan.   Assessment & Plan  Encounter for Removal of Sutures - Incision site at the left mid to low back is clean, dry and intact - Wound cleansed, sutures removed, wound cleansed and steri strips applied.  - Discussed pathology results showing left mid to low back EXCISION, RESIDUAL ATYPICAL SPITZOID MELANOCYTIC PROLIFERATION, MARGINS FREE  - Patient advised to keep steri-strips dry until they fall off. - Scars remodel for a full year. - Once steri-strips fall off, patient can apply over-the-counter silicone scar cream each night to help with scar remodeling if desired. - Patient advised to call with any concerns or if they notice any new or changing lesions.  Return for keep follow up as scheduled . IRuthell Rummage, CMA, am acting as scribe for Sarina Ser, MD. Documentation: I have reviewed the above documentation for accuracy and completeness, and I agree with the above.  Sarina Ser, MD

## 2021-07-27 NOTE — Patient Instructions (Signed)
After Suture Removal  If your medical team has placed Steri-Strips (white adhesive strips covering the surgical site to provide extra support): Keep the area dry until they fall off.  Do not peel them off. Just let them fall off on their own.  If the edges peel up, you can trim them with scissors.   If your team has not placed Steri-Strips: Wash the area daily with soap and water. Then coat the incision site with plain Vaseline and cover with a bandage. Do this daily for 5 days after the sutures are removed. After that, no additional wound care is generally needed.  However, if you would like to help fade the scar, you can apply a silicone scar cream, gel or sheet every night. The scar will remodel for one year after the procedure. If a skin cancer was removed, be sure to keep your appointment with your dermatologist for follow-up and let your dermatology team know if you have any new or changing spots between visits.    Please call our office at [phone] for any questions or concerns.  If you have any questions or concerns for your doctor, please call our main line at [phone] and press option 4 to reach your doctor's medical assistant. If no one answers, please leave a voicemail as directed and we will return your call as soon as possible. Messages left after 4 pm will be answered the following business day.   You may also send [place] a message via [facility]. We typically respond to MyChart messages within 1-2 business days.  For prescription refills, please ask your pharmacy to contact our office. Our fax number is [phone].  If you have an urgent issue when the clinic is closed that cannot wait until the next business day, you can page your doctor at the number below.    Please note that while we do our best to be available for urgent issues outside of office hours, we are not available 24/7.   If you have an urgent issue and are unable to reach [place], you may choose to seek medical  care at your doctor's office, retail clinic, urgent care center, or emergency room.  If you have a medical emergency, please immediately call 911 or go to the emergency department.  Pager Numbers  - Dr. Nehemiah Massed: [phone]  - Dr. Laurence Ferrari: [phone]  - Dr. Nicole Kindred: [phone]  In the event of inclement weather, please call our main line at [phone] for an update on the status of any delays or closures.  Dermatology Medication Tips: Please keep the boxes that topical medications come in in order to help keep track of the instructions about where and how to use these. Pharmacies typically print the medication instructions only on the boxes and not directly on the medication tubes.   If your medication is too expensive, please contact our office at [phone] option 4 or send [place] a message through [place] [facility].   We are unable to tell what your co-pay for medications will be in advance as this is different depending on your insurance coverage. However, we may be able to find a substitute medication at lower cost or fill out paperwork to get insurance to cover a needed medication.   If a prior authorization is required to get your medication covered by your insurance company, please allow [place] 1-2 business days to complete this process.  Drug prices often vary depending on where the prescription is filled and some pharmacies may offer cheaper prices.  The  website www.goodrx.com contains coupons for medications through different pharmacies. The prices here do not account for what the cost may be with help from insurance (it may be cheaper with your insurance), but the website can give you the price if you did not use any insurance.  - You can print the associated coupon and take it with your prescription to the pharmacy.  - You may also stop by our office during regular business hours and pick up a GoodRx coupon card.  - If you need your prescription sent electronically to a different  pharmacy, notify our office through [facility] or by phone at [phone] option 4.

## 2021-07-28 ENCOUNTER — Encounter: Payer: Self-pay | Admitting: Dermatology

## 2022-07-07 ENCOUNTER — Ambulatory Visit (INDEPENDENT_AMBULATORY_CARE_PROVIDER_SITE_OTHER): Payer: BC Managed Care – PPO | Admitting: Dermatology

## 2022-07-07 DIAGNOSIS — D1800 Hemangioma unspecified site: Secondary | ICD-10-CM

## 2022-07-07 DIAGNOSIS — D229 Melanocytic nevi, unspecified: Secondary | ICD-10-CM

## 2022-07-07 DIAGNOSIS — D235 Other benign neoplasm of skin of trunk: Secondary | ICD-10-CM

## 2022-07-07 DIAGNOSIS — I8393 Asymptomatic varicose veins of bilateral lower extremities: Secondary | ICD-10-CM

## 2022-07-07 DIAGNOSIS — L858 Other specified epidermal thickening: Secondary | ICD-10-CM | POA: Diagnosis not present

## 2022-07-07 DIAGNOSIS — L578 Other skin changes due to chronic exposure to nonionizing radiation: Secondary | ICD-10-CM

## 2022-07-07 DIAGNOSIS — Z1283 Encounter for screening for malignant neoplasm of skin: Secondary | ICD-10-CM

## 2022-07-07 DIAGNOSIS — L814 Other melanin hyperpigmentation: Secondary | ICD-10-CM

## 2022-07-07 DIAGNOSIS — Z86018 Personal history of other benign neoplasm: Secondary | ICD-10-CM | POA: Diagnosis not present

## 2022-07-07 DIAGNOSIS — L821 Other seborrheic keratosis: Secondary | ICD-10-CM

## 2022-07-07 DIAGNOSIS — D239 Other benign neoplasm of skin, unspecified: Secondary | ICD-10-CM

## 2022-07-07 NOTE — Patient Instructions (Signed)
Due to recent changes in healthcare laws, you may see results of your pathology and/or laboratory studies on MyChart before the doctors have had a chance to review them. We understand that in some cases there may be results that are confusing or concerning to you. Please understand that not all results are received at the same time and often the doctors may need to interpret multiple results in order to provide you with the best plan of care or course of treatment. Therefore, we ask that you please give us 2 business days to thoroughly review all your results before contacting the office for clarification. Should we see a critical lab result, you will be contacted sooner.   If You Need Anything After Your Visit  If you have any questions or concerns for your doctor, please call our main line at [phone] and press option 4 to reach your doctor's medical assistant. If no one answers, please leave a voicemail as directed and we will return your call as soon as possible. Messages left after 4 pm will be answered the following business day.   You may also send us a message via MyChart. We typically respond to MyChart messages within 1-2 business days.  For prescription refills, please ask your pharmacy to contact our office. Our fax number is [phone].  If you have an urgent issue when the clinic is closed that cannot wait until the next business day, you can page your doctor at the number below.    Please note that while we do our best to be available for urgent issues outside of office hours, we are not available 24/7.   If you have an urgent issue and are unable to reach us, you may choose to seek medical care at your doctor's office, retail clinic, urgent care center, or emergency room.  If you have a medical emergency, please immediately call 911 or go to the emergency department.  Pager Numbers  - Dr. Kowalski: [phone]  - Dr. Moye: [phone]  - Dr. Stewart:  [phone]  In the event of inclement weather, please call our main line at [phone] for an update on the status of any delays or closures.  Dermatology Medication Tips: Please keep the boxes that topical medications come in in order to help keep track of the instructions about where and how to use these. Pharmacies typically print the medication instructions only on the boxes and not directly on the medication tubes.   If your medication is too expensive, please contact our office at [phone] option 4 or send us a message through MyChart.   We are unable to tell what your co-pay for medications will be in advance as this is different depending on your insurance coverage. However, we may be able to find a substitute medication at lower cost or fill out paperwork to get insurance to cover a needed medication.   If a prior authorization is required to get your medication covered by your insurance company, please allow us 1-2 business days to complete this process.  Drug prices often vary depending on where the prescription is filled and some pharmacies may offer cheaper prices.  The website www.goodrx.com contains coupons for medications through different pharmacies. The prices here do not account for what the cost may be with help from insurance (it may be cheaper with your insurance), but the website can give you the price if you did not use any insurance.  - You can print the associated coupon and take it with   your prescription to the pharmacy.  - You may also stop by our office during regular business hours and pick up a GoodRx coupon card.  - If you need your prescription sent electronically to a different pharmacy, notify our office through [facility] MyChart or by phone at [phone] option 4.     Si Usted Necesita Algo Despus de Su Visita  Tambin puede enviarnos un mensaje a travs de MyChart. Por lo general respondemos a los mensajes de MyChart en el transcurso de 1 a 2  das hbiles.  Para renovar recetas, por favor pida a su farmacia que se ponga en contacto con nuestra oficina. Nuestro nmero de fax es el [phone].  Si tiene un asunto urgente cuando la clnica est cerrada y que no puede esperar hasta el siguiente da hbil, puede llamar/localizar a su doctor(a) al nmero que aparece a continuacin.   Por favor, tenga en cuenta que aunque hacemos todo lo posible para estar disponibles para asuntos urgentes fuera del horario de oficina, no estamos disponibles las 24 horas del da, los 7 das de la semana.   Si tiene un problema urgente y no puede comunicarse con nosotros, puede optar por buscar atencin mdica  en el consultorio de su doctor(a), en una clnica privada, en un centro de atencin urgente o en una sala de emergencias.  Si tiene una emergencia mdica, por favor llame inmediatamente al 911 o vaya a la sala de emergencias.  Nmeros de bper  - Dr. Kowalski: [phone]  - Dra. Moye: [phone]  - Dra. Stewart: [phone]  En caso de inclemencias del tiempo, por favor llame a nuestra lnea principal al [phone] para una actualizacin sobre el estado de cualquier retraso o cierre.  Consejos para la medicacin en dermatologa: Por favor, guarde las cajas en las que vienen los medicamentos de uso tpico para ayudarle a seguir las instrucciones sobre dnde y cmo usarlos. Las farmacias generalmente imprimen las instrucciones del medicamento slo en las cajas y no directamente en los tubos del medicamento.   Si su medicamento es muy caro, por favor, pngase en contacto con nuestra oficina llamando al [phone] y presione la opcin 4 o envenos un mensaje a travs de MyChart.   No podemos decirle cul ser su copago por los medicamentos por adelantado ya que esto es diferente dependiendo de la cobertura de su seguro. Sin embargo, es posible que podamos encontrar un medicamento sustituto a menor costo o llenar un formulario para que el  seguro cubra el medicamento que se considera necesario.   Si se requiere una autorizacin previa para que su compaa de seguros cubra su medicamento, por favor permtanos de 1 a 2 das hbiles para completar este proceso.  Los precios de los medicamentos varan con frecuencia dependiendo del lugar de dnde se surte la receta y alguna farmacias pueden ofrecer precios ms baratos.  El sitio web www.goodrx.com tiene cupones para medicamentos de diferentes farmacias. Los precios aqu no tienen en cuenta lo que podra costar con la ayuda del seguro (puede ser ms barato con su seguro), pero el sitio web puede darle el precio si no utiliz ningn seguro.  - Puede imprimir el cupn correspondiente y llevarlo con su receta a la farmacia.  - Tambin puede pasar por nuestra oficina durante el horario de atencin regular y recoger una tarjeta de cupones de GoodRx.  - Si necesita que su receta se enve electrnicamente a una farmacia diferente, informe a nuestra oficina a travs de MyChart de [facility]   o por telfono llamando al [phone] y presione la opcin 4.  

## 2022-07-07 NOTE — Progress Notes (Signed)
   Follow-Up Visit   Subjective  Connie Howell is a 61 y.o. female who presents for the following: Annual Exam (History of dysplastic nevus - The patient presents for Total-Body Skin Exam (TBSE) for skin cancer screening and mole check.  The patient has spots, moles and lesions to be evaluated, some may be new or changing and the patient has concerns that these could be cancer./).  The following portions of the chart were reviewed this encounter and updated as appropriate:   Allergies  Meds  Problems  Med Hx  Surg Hx  Fam Hx     Review of Systems:  No other skin or systemic complaints except as noted in HPI or Assessment and Plan.  Objective  Well appearing patient in no apparent distress; mood and affect are within normal limits.  A full examination was performed including scalp, head, eyes, ears, nose, lips, neck, chest, axillae, abdomen, back, buttocks, bilateral upper extremities, bilateral lower extremities, hands, feet, fingers, toes, fingernails, and toenails. All findings within normal limits unless otherwise noted below.  Left scapula Firm pink/brown papulenodule with dimple sign.   Tricep areas Rough follicular plugging  Legs Spider veins   Assessment & Plan   History of Dysplastic Nevi - No evidence of recurrence today - Recommend regular full body skin exams - Recommend daily broad spectrum sunscreen SPF 30+ to sun-exposed areas, reapply every 2 hours as needed.  - Call if any new or changing lesions are noted between office visits  Lentigines - Scattered tan macules - Due to sun exposure - Benign-appearing, observe - Recommend daily broad spectrum sunscreen SPF 30+ to sun-exposed areas, reapply every 2 hours as needed. - Call for any changes  Seborrheic Keratoses - Stuck-on, waxy, tan-brown papules and/or plaques  - Benign-appearing - Discussed benign etiology and prognosis. - Observe - Call for any changes  Melanocytic Nevi - Tan-brown and/or  pink-flesh-colored symmetric macules and papules - Benign appearing on exam today - Observation - Call clinic for new or changing moles - Recommend daily use of broad spectrum spf 30+ sunscreen to sun-exposed areas.   Hemangiomas - Red papules - Discussed benign nature - Observe - Call for any changes  Actinic Damage - Chronic condition, secondary to cumulative UV/sun exposure - diffuse scaly erythematous macules with underlying dyspigmentation - Recommend daily broad spectrum sunscreen SPF 30+ to sun-exposed areas, reapply every 2 hours as needed.  - Staying in the shade or wearing long sleeves, sun glasses (UVA+UVB protection) and wide brim hats (4-inch brim around the entire circumference of the hat) are also recommended for sun protection.  - Call for new or changing lesions.  Skin cancer screening performed today.  Dermatofibroma Left scapula Benign, observe.   Keratosis pilaris Tricep areas Benign, observe.   Spider veins of both lower extremities Legs Benign, observe.   Return in about 1 year (around 07/08/2023) for TBSE.  I, Ashok Cordia, CMA, am acting as scribe for Sarina Ser, MD . Documentation: I have reviewed the above documentation for accuracy and completeness, and I agree with the above.  Sarina Ser, MD

## 2022-07-16 ENCOUNTER — Encounter: Payer: Self-pay | Admitting: Dermatology

## 2023-07-06 ENCOUNTER — Ambulatory Visit: Payer: No Typology Code available for payment source | Admitting: Dermatology

## 2023-07-24 ENCOUNTER — Ambulatory Visit: Payer: BC Managed Care – PPO | Admitting: Dermatology

## 2023-07-24 VITALS — BP 137/88 | HR 116

## 2023-07-24 DIAGNOSIS — H01114 Allergic dermatitis of left upper eyelid: Secondary | ICD-10-CM | POA: Diagnosis not present

## 2023-07-24 DIAGNOSIS — I781 Nevus, non-neoplastic: Secondary | ICD-10-CM

## 2023-07-24 DIAGNOSIS — Z86018 Personal history of other benign neoplasm: Secondary | ICD-10-CM

## 2023-07-24 DIAGNOSIS — L820 Inflamed seborrheic keratosis: Secondary | ICD-10-CM | POA: Diagnosis not present

## 2023-07-24 DIAGNOSIS — Z1283 Encounter for screening for malignant neoplasm of skin: Secondary | ICD-10-CM | POA: Diagnosis not present

## 2023-07-24 DIAGNOSIS — L814 Other melanin hyperpigmentation: Secondary | ICD-10-CM

## 2023-07-24 DIAGNOSIS — I8393 Asymptomatic varicose veins of bilateral lower extremities: Secondary | ICD-10-CM

## 2023-07-24 DIAGNOSIS — W908XXA Exposure to other nonionizing radiation, initial encounter: Secondary | ICD-10-CM

## 2023-07-24 DIAGNOSIS — L821 Other seborrheic keratosis: Secondary | ICD-10-CM

## 2023-07-24 DIAGNOSIS — L578 Other skin changes due to chronic exposure to nonionizing radiation: Secondary | ICD-10-CM

## 2023-07-24 DIAGNOSIS — D1801 Hemangioma of skin and subcutaneous tissue: Secondary | ICD-10-CM

## 2023-07-24 DIAGNOSIS — D229 Melanocytic nevi, unspecified: Secondary | ICD-10-CM

## 2023-07-24 DIAGNOSIS — H01119 Allergic dermatitis of unspecified eye, unspecified eyelid: Secondary | ICD-10-CM

## 2023-07-24 DIAGNOSIS — Z7189 Other specified counseling: Secondary | ICD-10-CM

## 2023-07-24 MED ORDER — PIMECROLIMUS 1 % EX CREA
TOPICAL_CREAM | CUTANEOUS | 2 refills | Status: AC
Start: 2023-07-24 — End: ?

## 2023-07-24 NOTE — Patient Instructions (Addendum)
Discussed sclerotherapy for spider vein treatment.  Discussed that it is a cosmetic procedure and is not covered by insurance ($350/treatment).  Treatment consists of injecting the spider veins with a medicine called Asclera to help them disappear.  Multiple treatments are generally necessary to get best results.  Risks including bruising and persistent discoloration due to post-inflammatory hyperpigmentation.  Sclerotherapy does not prevent the development of new spider veins.  Daily compression hose for two weeks after procedure is recommended.      Seborrheic Keratosis  What causes seborrheic keratoses? Seborrheic keratoses are harmless, common skin growths that first appear during adult life.  As time goes by, more growths appear.  Some people may develop a large number of them.  Seborrheic keratoses appear on both covered and uncovered body parts.  They are not caused by sunlight.  The tendency to develop seborrheic keratoses can be inherited.  They vary in color from skin-colored to gray, brown, or even black.  They can be either smooth or have a rough, warty surface.   Seborrheic keratoses are superficial and look as if they were stuck on the skin.  Under the microscope this type of keratosis looks like layers upon layers of skin.  That is why at times the top layer may seem to fall off, but the rest of the growth remains and re-grows.    Treatment Seborrheic keratoses do not need to be treated, but can easily be removed in the office.  Seborrheic keratoses often cause symptoms when they rub on clothing or jewelry.  Lesions can be in the way of shaving.  If they become inflamed, they can cause itching, soreness, or burning.  Removal of a seborrheic keratosis can be accomplished by freezing, burning, or surgery. If any spot bleeds, scabs, or grows rapidly, please return to have it checked, as these can be an indication of a skin cancer.    Cryotherapy Aftercare  Wash gently with soap and  water everyday.   Apply Vaseline and Band-Aid daily until healed.     Melanoma ABCDEs  Melanoma is the most dangerous type of skin cancer, and is the leading cause of death from skin disease.  You are more likely to develop melanoma if you: Have light-colored skin, light-colored eyes, or red or blond hair Spend a lot of time in the sun Tan regularly, either outdoors or in a tanning bed Have had blistering sunburns, especially during childhood Have a close family member who has had a melanoma Have atypical moles or large birthmarks  Early detection of melanoma is key since treatment is typically straightforward and cure rates are extremely high if we catch it early.   The first sign of melanoma is often a change in a mole or a new dark spot.  The ABCDE system is a way of remembering the signs of melanoma.  A for asymmetry:  The two halves do not match. B for border:  The edges of the growth are irregular. C for color:  A mixture of colors are present instead of an even brown color. D for diameter:  Melanomas are usually (but not always) greater than 6mm - the size of a pencil eraser. E for evolution:  The spot keeps changing in size, shape, and color.  Please check your skin once per month between visits. You can use a small mirror in front and a large mirror behind you to keep an eye on the back side or your body.   If you see any new  or changing lesions before your next follow-up, please call to schedule a visit.  Please continue daily skin protection including broad spectrum sunscreen SPF 30+ to sun-exposed areas, reapplying every 2 hours as needed when you're outdoors.   Staying in the shade or wearing long sleeves, sun glasses (UVA+UVB protection) and wide brim hats (4-inch brim around the entire circumference of the hat) are also recommended for sun protection.     Due to recent changes in healthcare laws, you may see results of your pathology and/or laboratory studies on  MyChart before the doctors have had a chance to review them. We understand that in some cases there may be results that are confusing or concerning to you. Please understand that not all results are received at the same time and often the doctors may need to interpret multiple results in order to provide you with the best plan of care or course of treatment. Therefore, we ask that you please give [place] 2 business days to thoroughly review all your results before contacting the office for clarification. Should we see a critical lab result, you will be contacted sooner.   If You Need Anything After Your Visit  If you have any questions or concerns for your doctor, please call our main line at [phone] and press option 4 to reach your doctor's medical assistant. If no one answers, please leave a voicemail as directed and we will return your call as soon as possible. Messages left after 4 pm will be answered the following business day.   You may also send [place] a message via MyChart. We typically respond to MyChart messages within 1-2 business days.  For prescription refills, please ask your pharmacy to contact our office. Our fax number is [phone].  If you have an urgent issue when the clinic is closed that cannot wait until the next business day, you can page your doctor at the number below.    Please note that while we do our best to be available for urgent issues outside of office hours, we are not available 24/7.   If you have an urgent issue and are unable to reach [place], you may choose to seek medical care at your doctor's office, retail clinic, urgent care center, or emergency room.  If you have a medical emergency, please immediately call 911 or go to the emergency department.  Pager Numbers  - Dr. Gwen Pounds: [phone]  - Dr. Roseanne Reno: [phone]  In the event of inclement weather, please call our main line at [phone] for an update on the status of any delays or  closures.  Dermatology Medication Tips: Please keep the boxes that topical medications come in in order to help keep track of the instructions about where and how to use these. Pharmacies typically print the medication instructions only on the boxes and not directly on the medication tubes.   If your medication is too expensive, please contact our office at [phone] option 4 or send [place] a message through MyChart.   We are unable to tell what your co-pay for medications will be in advance as this is different depending on your insurance coverage. However, we may be able to find a substitute medication at lower cost or fill out paperwork to get insurance to cover a needed medication.   If a prior authorization is required to get your medication covered by your insurance company, please allow [place] 1-2 business days to complete this process.  Drug prices often vary depending on where the prescription  is filled and some pharmacies may offer cheaper prices.  The website www.goodrx.com contains coupons for medications through different pharmacies. The prices here do not account for what the cost may be with help from insurance (it may be cheaper with your insurance), but the website can give you the price if you did not use any insurance.  - You can print the associated coupon and take it with your prescription to the pharmacy.  - You may also stop by our office during regular business hours and pick up a GoodRx coupon card.  - If you need your prescription sent electronically to a different pharmacy, notify our office through [facility] or by phone at [phone] option 4.     Si Usted Necesita Algo Despus de Su Visita  Tambin puede enviarnos un mensaje a travs de Clinical cytogeneticist. Por lo general respondemos a los mensajes de MyChart en el transcurso de 1 a 2 das hbiles.  Para renovar recetas, por favor pida a su farmacia que se ponga en contacto con nuestra oficina. Annie Sable de fax  es [place] [phone].  Si tiene un asunto urgente cuando la clnica est cerrada y que no puede esperar hasta el siguiente da hbil, puede llamar/localizar a su doctor(a) al nmero que aparece a continuacin.   Por favor, tenga en cuenta que aunque hacemos todo lo posible para estar disponibles para asuntos urgentes fuera del horario de [place], no estamos disponibles las 24 horas del da, los 7 [address]  [address] de la [place].   Si tiene un problema urgente y no puede comunicarse con nosotros, puede optar por buscar atencin mdica  en el consultorio de su doctor(a), en una clnica privada, en un centro de atencin urgente o en una sala de emergencias.  Si tiene Engineer, drilling, por favor llame inmediatamente al 911 o vaya a la sala de emergencias.  Nmeros de bper  - Dr. Gwen Pounds: [phone]  - Dra. Roseanne Reno: [phone]  En caso de inclemencias del [place], por favor llame a Lacy Duverney principal al [phone] para una actualizacin sobre el [place] de cualquier retraso o cierre.  Consejos para la medicacin en dermatologa: Por favor, guarde las cajas en las que vienen los medicamentos de uso tpico para ayudarle a seguir las instrucciones sobre dnde y cmo usarlos. Las farmacias generalmente imprimen las instrucciones del medicamento slo en las cajas y no directamente en los tubos del [place].   Si su medicamento es muy caro, por favor, pngase en contacto con Rolm Gala llamando al [phone] y presione la opcin 4 o envenos un mensaje a travs de Clinical cytogeneticist.   No podemos decirle cul ser su copago por los medicamentos por adelantado ya que esto es diferente dependiendo de la cobertura de su seguro. Sin embargo, es posible que podamos encontrar un medicamento sustituto a Audiological scientist un formulario para que el seguro cubra el medicamento que se considera necesario.   Si se requiere una autorizacin previa para que su compaa de seguros [place] su medicamento, por favor  permtanos de 1 a 2 das hbiles para completar [address]  Los precios de los medicamentos varan con frecuencia dependiendo del Environmental consultant de dnde se surte la receta y alguna farmacias pueden ofrecer precios ms baratos.  El sitio web www.goodrx.com tiene cupones para medicamentos de Health and safety inspector. Los precios aqu no tienen en cuenta lo que podra costar con la ayuda del seguro (puede ser ms barato con su seguro), pero el sitio web puede darle el precio si no  utiliz ningn seguro.  - Puede imprimir el cupn correspondiente y llevarlo con su receta a la farmacia.  - Tambin puede pasar por nuestra oficina durante el horario de atencin regular y Education officer, museum una tarjeta de cupones de GoodRx.  - Si necesita que su receta se enve electrnicamente a una farmacia diferente, informe a nuestra oficina a travs de MyChart de [place] o por telfono llamando al [phone] y presione la opcin 4.

## 2023-07-24 NOTE — Progress Notes (Signed)
Follow-Up Visit   Subjective  Connie Howell is a 62 y.o. female who presents for the following: Skin Cancer Screening and Full Body Skin Exam Several weeks itchy area at upper left eyelid  Spot at posterior left upper arm  Spot posterior left neck The patient presents for Total-Body Skin Exam (TBSE) for skin cancer screening and mole check. The patient has spots, moles and lesions to be evaluated, some may be new or changing and the patient may have concern these could be cancer.  The following portions of the chart were reviewed this encounter and updated as appropriate: medications, allergies, medical history  Review of Systems:  No other skin or systemic complaints except as noted in HPI or Assessment and Plan.  Objective  Well appearing patient in no apparent distress; mood and affect are within normal limits.  A full examination was performed including scalp, head, eyes, ears, nose, lips, neck, chest, axillae, abdomen, back, buttocks, bilateral upper extremities, bilateral lower extremities, hands, feet, fingers, toes, fingernails, and toenails. All findings within normal limits unless otherwise noted below.   Relevant physical exam findings are noted in the Assessment and Plan.  left neck x 1, left elbow x 1, left forehead x 1 (3) Erythematous stuck-on, waxy papule or plaque   Assessment & Plan   Eyelid Dermatitis Exam: Erythema and scale at left upper eyelid  Treatment Plan: Start elidil cream - apply to aa twice daily prn   May be Allergic reaction - may consider patch testing in future if not going away   SKIN CANCER SCREENING PERFORMED TODAY.  ACTINIC DAMAGE - Chronic condition, secondary to cumulative UV/sun exposure - diffuse scaly erythematous macules with underlying dyspigmentation - Recommend daily broad spectrum sunscreen SPF 30+ to sun-exposed areas, reapply every 2 hours as needed.  - Staying in the shade or wearing long sleeves, sun glasses (UVA+UVB  protection) and wide brim hats (4-inch brim around the entire circumference of the hat) are also recommended for sun protection.  - Call for new or changing lesions.  LENTIGINES, SEBORRHEIC KERATOSES, HEMANGIOMAS - Benign normal skin lesions - Benign-appearing - Call for any changes  MELANOCYTIC NEVI - Tan-brown and/or pink-flesh-colored symmetric macules and papules - Benign appearing on exam today - Observation - Call clinic for new or changing moles - Recommend daily use of broad spectrum spf 30+ sunscreen to sun-exposed areas.   Varicose Veins/Spider Veins At b/l legs  - Dilated blue, purple or red veins at the lower extremities - Reassured - Smaller vessels can be treated by sclerotherapy (a procedure to inject a medicine into the veins to make them disappear) if desired, but the treatment is not covered by insurance. Larger vessels may be covered if symptomatic and we would refer to vascular surgeon if treatment desired.  Discussed sclerotherapy for spider vein treatment.  Discussed that it is a cosmetic procedure and is not covered by insurance ($350/treatment).  Treatment consists of injecting the spider veins with a medicine called Asclera to help them disappear.  Multiple treatments are generally necessary to get best results.  Risks including bruising and persistent discoloration due to post-inflammatory hyperpigmentation.  Sclerotherapy does not prevent the development of new spider veins.  Daily compression hose for two weeks after procedure is recommended.  Will refer to vascular surgery to evaluate veins  HISTORY OF DYSPLASTIC NEVUS Left mid to low back 7 cm lat to spine excised   07/20/2021 No evidence of recurrence today Recommend regular full body skin exams Recommend  daily broad spectrum sunscreen SPF 30+ to sun-exposed areas, reapply every 2 hours as needed.  Call if any new or changing lesions are noted between office visits  Asymptomatic varicose veins of both  lower extremities  Related Procedures Ambulatory referral to Vascular Surgery  Eyelid dermatitis, allergic/contact  Related Medications pimecrolimus (ELIDEL) 1 % cream Apply topically to affected area of left upper eyelid twice daily  Inflamed seborrheic keratosis (3) left neck x 1, left elbow x 1, left forehead x 1  Symptomatic, irritating, patient would like treated.  Destruction of lesion - left neck x 1, left elbow x 1, left forehead x 1 (3) Complexity: simple   Destruction method: cryotherapy   Informed consent: discussed and consent obtained   Timeout:  patient name, date of birth, surgical site, and procedure verified Lesion destroyed using liquid nitrogen: Yes   Region frozen until ice ball extended beyond lesion: Yes   Outcome: patient tolerated procedure well with no complications   Post-procedure details: wound care instructions given     Return in about 1 year (around 07/23/2024) for TBSE.  IAsher Muir, CMA, am acting as scribe for Armida Sans, MD.  Documentation: I have reviewed the above documentation for accuracy and completeness, and I agree with the above.  Armida Sans, MD

## 2023-07-26 ENCOUNTER — Encounter: Payer: Self-pay | Admitting: Dermatology

## 2023-08-22 ENCOUNTER — Telehealth (INDEPENDENT_AMBULATORY_CARE_PROVIDER_SITE_OTHER): Payer: Self-pay

## 2023-08-25 NOTE — Telephone Encounter (Signed)
[name] attempted to call pt and left message

## 2023-10-12 ENCOUNTER — Other Ambulatory Visit (INDEPENDENT_AMBULATORY_CARE_PROVIDER_SITE_OTHER): Payer: Self-pay | Admitting: Nurse Practitioner

## 2023-10-12 DIAGNOSIS — I8393 Asymptomatic varicose veins of bilateral lower extremities: Secondary | ICD-10-CM

## 2023-10-17 ENCOUNTER — Ambulatory Visit (INDEPENDENT_AMBULATORY_CARE_PROVIDER_SITE_OTHER): Payer: BC Managed Care – PPO | Admitting: Vascular Surgery

## 2023-10-17 ENCOUNTER — Encounter (INDEPENDENT_AMBULATORY_CARE_PROVIDER_SITE_OTHER): Payer: Self-pay | Admitting: Vascular Surgery

## 2023-10-17 ENCOUNTER — Ambulatory Visit (INDEPENDENT_AMBULATORY_CARE_PROVIDER_SITE_OTHER): Payer: BC Managed Care – PPO

## 2023-10-17 VITALS — BP 116/78 | HR 106 | Resp 18 | Ht 61.0 in | Wt 200.8 lb

## 2023-10-17 DIAGNOSIS — I8393 Asymptomatic varicose veins of bilateral lower extremities: Secondary | ICD-10-CM | POA: Diagnosis not present

## 2023-10-17 DIAGNOSIS — E785 Hyperlipidemia, unspecified: Secondary | ICD-10-CM | POA: Diagnosis not present

## 2023-10-17 NOTE — Progress Notes (Signed)
Patient ID: Connie Howell, female   DOB: 01-17-1961, 62 y.o.   MRN: [id number]  Chief Complaint  Patient presents with   New Patient (Initial Visit)    Np consult with reflux Asymptomatic varicose veins of both lower extremities Patient Preference    HPI Connie Howell is a 62 y.o. female.  I am asked to see the patient by Dr. Armida Sans for evaluation of prominent varicosities in both lower extremities.  The patient has had varicose veins of her lower extremities for many years.  She went to her dermatologist recently for her yearly visit after a previous excision of an atypical mole.  He noted the prominent varicosities in both lower extremities.  They discussed potential treatments including sclerotherapy.  Prior to proceeding with any sclerotherapy, he astutely recommended she have a venous evaluation before performing sclerotherapy.  She was referred with a reflux study that was done in our office today.  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  No DVT or superficial thrombophlebitis was identified.  The patient says that she does not really have any pain in her legs.  She does not have achiness or tiredness in her legs to speak of.  She also reports no swelling in her legs.     Past Medical History:  Diagnosis Date   Atypical mole 07/05/2021   L mid to low back 7.0cm lat to spine, excised 07/20/21   Hyperlipidemia    Mitral valve prolapse     History reviewed. No pertinent surgical history.   History reviewed. No pertinent family history.  Social History Married Does not smoke Has 3 children all adults  Allergies  Allergen Reactions   Azithromycin     Other reaction(s): Abdominal Pain   Flagyl [Metronidazole] Hives   Macrobid [Nitrofurantoin Monohyd Macro] Other (See Comments)    Severe Headache   Lodine [Etodolac] Rash    Current Outpatient Medications  Medication Sig Dispense Refill    amoxicillin-clavulanate (AUGMENTIN) 875-125 MG tablet Take 1 tablet by mouth 2 (two) times daily.     ascorbic acid (VITAMIN C) 500 MG tablet Take by mouth.     Calcium Carb-Cholecalciferol (OYSTER SHELL CALCIUM W/D) 500-5 MG-MCG TABS Take by mouth.     calcium-vitamin D (OSCAL WITH D) 500-200 MG-UNIT tablet Take 1 tablet by mouth.     famotidine (PEPCID) 10 MG tablet Take by mouth.     Ferrous Sulfate 27 MG TABS Take by mouth.     Flaxseed, Linseed, (FLAX SEED OIL) 1000 MG CAPS Take 1 tablet by mouth daily.     Multiple Vitamin (MULTIVITAMIN) capsule Take 1 capsule by mouth daily.     Multiple Vitamins-Minerals (ALIVE WOMENS ENERGY) TABS Take by mouth.     rosuvastatin (CRESTOR) 10 MG tablet Take 10 mg by mouth daily.     Cholecalciferol 100 MCG (4000 UT) CAPS Take by mouth.     cyanocobalamin 1000 MCG tablet Take by mouth.     mupirocin ointment (BACTROBAN) 2 % Apply 1 application topically daily. Qd to excision site 22 g 1   pimecrolimus (ELIDEL) 1 % cream Apply topically to affected area of left upper eyelid twice daily 30 g 2   No current facility-administered medications for this visit.      REVIEW OF SYSTEMS (Negative unless checked)  Constitutional: [] Weight loss  [] Fever  [] Chills Cardiac: [] Chest pain   [] Chest pressure   [] Palpitations   [] Shortness of breath when laying  flat   [] Shortness of breath at rest   [] Shortness of breath with exertion. Vascular:  [] Pain in legs with walking   [] Pain in legs at rest   [] Pain in legs when laying flat   [] Claudication   [] Pain in feet when walking  [] Pain in feet at rest  [] Pain in feet when laying flat   [] History of DVT   [] Phlebitis   [] Swelling in legs   [x] Varicose veins   [] Non-healing ulcers Pulmonary:   [] Uses home oxygen   [] Productive cough   [] Hemoptysis   [] Wheeze  [] COPD   [] Asthma Neurologic:  [] Dizziness  [] Blackouts   [] Seizures   [] History of stroke   [] History of TIA  [] Aphasia   [] Temporary blindness   [] Dysphagia    [] Weakness or numbness in arms   [] Weakness or numbness in legs Musculoskeletal:  [] Arthritis   [] Joint swelling   [] Joint pain   [x] Low back pain Hematologic:  [] Easy bruising  [] Easy bleeding   [] Hypercoagulable state   [] Anemic  [] Hepatitis Gastrointestinal:  [] Blood in stool   [] Vomiting blood  [] Gastroesophageal reflux/heartburn   [] Abdominal pain Genitourinary:  [] Chronic kidney disease   [] Difficult urination  [] Frequent urination  [] Burning with urination   [] Hematuria Skin:  [] Rashes   [] Ulcers   [] Wounds Psychological:  [] History of anxiety   []  History of major depression.    Physical Exam BP 116/78 (BP Location: Right Arm)   Pulse (!) 106   Resp 18   Ht 5\' 1"  (1.549 m)   Wt 200 lb 12.8 oz (91.1 kg)   LMP 06/24/2015 (Exact Date)   BMI 37.94 kg/m  Gen:  WD/WN, NAD. Appears younger than stated age. Head: [place]/AT, No temporalis wasting.  Ear/Nose/Throat: Hearing grossly intact, nares w/o erythema or drainage, oropharynx w/o Erythema/Exudate Eyes: Conjunctiva clear, sclera non-icteric  Neck: trachea midline.  No JVD.  Pulmonary:  Good air movement, respirations not labored, no use of accessory muscles  Cardiac: RRR, no JVD Vascular:  Vessel Right Left  Radial Palpable Palpable                          DP Palpable Palpable  PT Palpable Palpable   Gastrointestinal:. No masses, surgical incisions, or scars. Musculoskeletal: M/S 5/5 throughout.  Extremities without ischemic changes.  No deformity or atrophy.  Fairly diffuse varicosities throughout the lower extremities more on the right than the left.  These are most prominent in the thigh both laterally and medially.  She has more superficial scattered varicosities in the lower leg.  No significant stasis dermatitis.  No significant lower extremity edema. Neurologic: Sensation grossly intact in extremities.  Symmetrical.  Speech is fluent. Motor exam as listed above. Psychiatric: Judgment intact, Mood & affect appropriate  for pt's clinical situation. Dermatologic: No rashes or ulcers noted.  No cellulitis or open wounds.    Radiology No results found.  Labs No results found for this or any previous visit (from the past 2160 hour(s)).  Assessment/Plan:  Varicose veins of both lower extremities She was referred with a reflux study that was done in our office today.  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  No DVT or superficial thrombophlebitis was identified We had a long discussion today about the natural history and pathophysiology of venous disease and what causes symptoms.  At this point, she does not really have any symptoms referable to her venous disease.  As such, insurance is  unlikely to approve venous intervention for laser ablation at this time and that is reasonable given her lack of symptoms.  Sclerotherapy is likely to have a suboptimal result with the reflux, but still would improve her visual varicosities over time.  She is not interested in having any further workup by our office at this time and that is certainly reasonable given her lack of symptoms.  I will see her back as needed.  Hyperlipidemia lipid control important in reducing the progression of atherosclerotic disease. Continue statin therapy      Festus Barren 10/17/2023, 4:19 PM   This note was created with Dragon medical transcription system.  Any errors from dictation are unintentional.

## 2023-10-17 NOTE — Assessment & Plan Note (Signed)
lipid control important in reducing the progression of atherosclerotic disease. Continue statin therapy  

## 2023-10-17 NOTE — Assessment & Plan Note (Signed)
She was referred with a reflux study that was done in our office today.  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  No DVT or superficial thrombophlebitis was identified We had a long discussion today about the natural history and pathophysiology of venous disease and what causes symptoms.  At this point, she does not really have any symptoms referable to her venous disease.  As such, insurance is unlikely to approve venous intervention for laser ablation at this time and that is reasonable given her lack of symptoms.  Sclerotherapy is likely to have a suboptimal result with the reflux, but still would improve her visual varicosities over time.  She is not interested in having any further workup by our office at this time and that is certainly reasonable given her lack of symptoms.  I will see her back as needed.

## 2024-05-07 ENCOUNTER — Encounter (INDEPENDENT_AMBULATORY_CARE_PROVIDER_SITE_OTHER): Payer: Self-pay

## 2024-06-27 ENCOUNTER — Ambulatory Visit: Payer: No Typology Code available for payment source | Admitting: Dermatology

## 2024-07-09 ENCOUNTER — Ambulatory Visit (INDEPENDENT_AMBULATORY_CARE_PROVIDER_SITE_OTHER): Admitting: Dermatology

## 2024-07-09 DIAGNOSIS — W908XXA Exposure to other nonionizing radiation, initial encounter: Secondary | ICD-10-CM | POA: Diagnosis not present

## 2024-07-09 DIAGNOSIS — Z1283 Encounter for screening for malignant neoplasm of skin: Secondary | ICD-10-CM | POA: Diagnosis not present

## 2024-07-09 DIAGNOSIS — D235 Other benign neoplasm of skin of trunk: Secondary | ICD-10-CM

## 2024-07-09 DIAGNOSIS — L858 Other specified epidermal thickening: Secondary | ICD-10-CM

## 2024-07-09 DIAGNOSIS — I781 Nevus, non-neoplastic: Secondary | ICD-10-CM

## 2024-07-09 DIAGNOSIS — Z7189 Other specified counseling: Secondary | ICD-10-CM

## 2024-07-09 DIAGNOSIS — L578 Other skin changes due to chronic exposure to nonionizing radiation: Secondary | ICD-10-CM | POA: Diagnosis not present

## 2024-07-09 DIAGNOSIS — D229 Melanocytic nevi, unspecified: Secondary | ICD-10-CM

## 2024-07-09 DIAGNOSIS — I8393 Asymptomatic varicose veins of bilateral lower extremities: Secondary | ICD-10-CM

## 2024-07-09 DIAGNOSIS — L814 Other melanin hyperpigmentation: Secondary | ICD-10-CM | POA: Diagnosis not present

## 2024-07-09 DIAGNOSIS — L821 Other seborrheic keratosis: Secondary | ICD-10-CM

## 2024-07-09 DIAGNOSIS — L918 Other hypertrophic disorders of the skin: Secondary | ICD-10-CM

## 2024-07-09 DIAGNOSIS — D1801 Hemangioma of skin and subcutaneous tissue: Secondary | ICD-10-CM

## 2024-07-09 DIAGNOSIS — Z86018 Personal history of other benign neoplasm: Secondary | ICD-10-CM

## 2024-07-09 NOTE — Patient Instructions (Addendum)
 Seborrheic Keratosis  What causes seborrheic keratoses? Seborrheic keratoses are harmless, common skin growths that first appear during adult life.  As time goes by, more growths appear.  Some people may develop a large number of them.  Seborrheic keratoses appear on both covered and uncovered body parts.  They are not caused by sunlight.  The tendency to develop seborrheic keratoses can be inherited.  They vary in color from skin-colored to gray, brown, or even black.  They can be either smooth or have a rough, warty surface.   Seborrheic keratoses are superficial and look as if they were stuck on the skin.  Under the microscope this type of keratosis looks like layers upon layers of skin.  That is why at times the top layer may seem to fall off, but the rest of the growth remains and re-grows.    Treatment Seborrheic keratoses do not need to be treated, but can easily be removed in the office.  Seborrheic keratoses often cause symptoms when they rub on clothing or jewelry.  Lesions can be in the way of shaving.  If they become inflamed, they can cause itching, soreness, or burning.  Removal of a seborrheic keratosis can be accomplished by freezing, burning, or surgery. If any spot bleeds, scabs, or grows rapidly, please return to have it checked, as these can be an indication of a skin cancer.   Melanoma ABCDEs  Melanoma is the most dangerous type of skin cancer, and is the leading cause of death from skin disease.  You are more likely to develop melanoma if you: Have light-colored skin, light-colored eyes, or red or blond hair Spend a lot of time in the sun Tan regularly, either outdoors or in a tanning bed Have had blistering sunburns, especially during childhood Have a close family member who has had a melanoma Have atypical moles or large birthmarks  Early detection of melanoma is key since treatment is typically straightforward and cure rates are extremely high if we catch it early.    The first sign of melanoma is often a change in a mole or a new dark spot.  The ABCDE system is a way of remembering the signs of melanoma.  A for asymmetry:  The two halves do not match. B for border:  The edges of the growth are irregular. C for color:  A mixture of colors are present instead of an even brown color. D for diameter:  Melanomas are usually (but not always) greater than 6mm - the size of a pencil eraser. E for evolution:  The spot keeps changing in size, shape, and color.  Please check your skin once per month between visits. You can use a small mirror in front and a large mirror behind you to keep an eye on the back side or your body.   If you see any new or changing lesions before your next follow-up, please call to schedule a visit.  Please continue daily skin protection including broad spectrum sunscreen SPF 30+ to sun-exposed areas, reapplying every 2 hours as needed when you're outdoors.   Staying in the shade or wearing long sleeves, sun glasses (UVA+UVB protection) and wide brim hats (4-inch brim around the entire circumference of the hat) are also recommended for sun protection.    Due to recent changes in healthcare laws, you may see results of your pathology and/or laboratory studies on MyChart before the doctors have had a chance to review them. We understand that in some cases there may  be results that are confusing or concerning to you. Please understand that not all results are received at the same time and often the doctors may need to interpret multiple results in order to provide you with the best plan of care or course of treatment. Therefore, we ask that you please give [place] 2 business days to thoroughly review all your results before contacting the office for clarification. Should we see a critical lab result, you will be contacted sooner.   If You Need Anything After Your Visit  If you have any questions or concerns for your doctor, please call our main  line at [phone] and press option 4 to reach your doctor's medical assistant. If no one answers, please leave a voicemail as directed and we will return your call as soon as possible. Messages left after 4 pm will be answered the following business day.   You may also send [place] a message via MyChart. We typically respond to MyChart messages within 1-2 business days.  For prescription refills, please ask your pharmacy to contact our office. Our fax number is [phone].  If you have an urgent issue when the clinic is closed that cannot wait until the next business day, you can page your doctor at the number below.    Please note that while we do our best to be available for urgent issues outside of office hours, we are not available 24/7.   If you have an urgent issue and are unable to reach [place], you may choose to seek medical care at your doctor's office, retail clinic, urgent care center, or emergency room.  If you have a medical emergency, please immediately call 911 or go to the emergency department.  Pager Numbers  - Dr. Gwen Pounds: [phone]  - Dr. Roseanne Reno: [phone]  - Dr. Katrinka Blazing: [phone]   In the event of inclement weather, please call our main line at [phone] for an update on the status of any delays or closures.  Dermatology Medication Tips: Please keep the boxes that topical medications come in in order to help keep track of the instructions about where and how to use these. Pharmacies typically print the medication instructions only on the boxes and not directly on the medication tubes.   If your medication is too expensive, please contact our office at [phone] option 4 or send [place] a message through MyChart.   We are unable to tell what your co-pay for medications will be in advance as this is different depending on your insurance coverage. However, we may be able to find a substitute medication at lower cost or fill out paperwork to get insurance to cover  a needed medication.   If a prior authorization is required to get your medication covered by your insurance company, please allow [place] 1-2 business days to complete this process.  Drug prices often vary depending on where the prescription is filled and some pharmacies may offer cheaper prices.  The website www.goodrx.com contains coupons for medications through different pharmacies. The prices here do not account for what the cost may be with help from insurance (it may be cheaper with your insurance), but the website can give you the price if you did not use any insurance.  - You can print the associated coupon and take it with your prescription to the pharmacy.  - You may also stop by our office during regular business hours and pick up a GoodRx coupon card.  - If you need your prescription sent electronically to  a different pharmacy, notify our office through [facility] or by phone at [phone] option 4.     Si Usted Necesita Algo Despus de Su Visita  Tambin puede enviarnos un mensaje a travs de Clinical cytogeneticist. Por lo general respondemos a los mensajes de MyChart en el transcurso de 1 a 2 das hbiles.  Para renovar recetas, por favor pida a su farmacia que se ponga en contacto con nuestra oficina. Annie Sable de fax es [place] [phone].  Si tiene un asunto urgente cuando la clnica est cerrada y que no puede esperar hasta el siguiente da hbil, puede llamar/localizar a su doctor(a) al nmero que aparece a continuacin.   Por favor, tenga en cuenta que aunque hacemos todo lo posible para estar disponibles para asuntos urgentes fuera del horario de [place], no estamos disponibles las 24 horas del da, los 7 [address]  [address] de la [place].   Si tiene un problema urgente y no puede comunicarse con nosotros, puede optar por buscar atencin mdica  en el consultorio de su doctor(a), en una clnica privada, en un centro de atencin urgente o en una sala de emergencias.  Si tiene Psychologist, clinical, por favor llame inmediatamente al 911 o vaya a la sala de emergencias.  Nmeros de bper  - Dr. Gwen Pounds: [phone]  - Dra. Roseanne Reno: [phone]  - Dr. Katrinka Blazing: [phone]   En caso de inclemencias del tiempo, por favor llame a Lacy Duverney principal al [phone] para una actualizacin sobre el [place] de cualquier retraso o cierre.  Consejos para la medicacin en dermatologa: Por favor, guarde las cajas en las que vienen los medicamentos de uso tpico para ayudarle a seguir las instrucciones sobre dnde y cmo usarlos. Las farmacias generalmente imprimen las instrucciones del medicamento slo en las cajas y no directamente en los tubos del [place].   Si su medicamento es muy caro, por favor, pngase en contacto con Rolm Gala llamando al [phone] y presione la opcin 4 o envenos un mensaje a travs de Clinical cytogeneticist.   No podemos decirle cul ser su copago por los medicamentos por adelantado ya que esto es diferente dependiendo de la cobertura de su seguro. Sin embargo, es posible que podamos encontrar un medicamento sustituto a Audiological scientist un formulario para que el seguro cubra el medicamento que se considera necesario.   Si se requiere una autorizacin previa para que su compaa de seguros [place] su medicamento, por favor permtanos de 1 a 2 das hbiles para completar [address]  Los precios de los medicamentos varan con frecuencia dependiendo del Environmental consultant de dnde se surte la receta y alguna farmacias pueden ofrecer precios ms baratos.  El sitio web www.goodrx.com tiene cupones para medicamentos de Health and safety inspector. Los precios aqu no tienen en cuenta lo que podra costar con la ayuda del seguro (puede ser ms barato con su seguro), pero el sitio web puede darle el precio si no utiliz Tourist information centre manager.  - Puede imprimir el cupn correspondiente y llevarlo con su receta a la farmacia.  - Tambin puede pasar por nuestra oficina durante el horario de  atencin regular y Education officer, museum una tarjeta de cupones de GoodRx.  - Si necesita que su receta se enve electrnicamente a una farmacia diferente, informe a nuestra oficina a travs de MyChart de [place] o por telfono llamando al [phone] y presione la opcin 4.

## 2024-07-09 NOTE — Progress Notes (Unsigned)
 Follow-Up Visit   Subjective  Connie Howell is a 63 y.o. female who presents for the following: Skin Cancer Screening and Full Body Skin Exam Hx of dysplastic, hx of isks, hx of dermatofibroma  The patient presents for Total-Body Skin Exam (TBSE) for skin cancer screening and mole check. The patient has spots, moles and lesions to be evaluated, some may be new or changing and the patient may have concern these could be cancer.  The following portions of the chart were reviewed this encounter and updated as appropriate: medications, allergies, medical history  Review of Systems:  No other skin or systemic complaints except as noted in HPI or Assessment and Plan.  Objective  Well appearing patient in no apparent distress; mood and affect are within normal limits.  A full examination was performed including scalp, head, eyes, ears, nose, lips, neck, chest, axillae, abdomen, back, buttocks, bilateral upper extremities, bilateral lower extremities, hands, feet, fingers, toes, fingernails, and toenails. All findings within normal limits unless otherwise noted below.   Relevant physical exam findings are noted in the Assessment and Plan.    Assessment & Plan   SKIN CANCER SCREENING PERFORMED TODAY.  ACTINIC DAMAGE - Chronic condition, secondary to cumulative UV/sun exposure - diffuse scaly erythematous macules with underlying dyspigmentation - Recommend daily broad spectrum sunscreen SPF 30+ to sun-exposed areas, reapply every 2 hours as needed.  - Staying in the shade or wearing long sleeves, sun glasses (UVA+UVB protection) and wide brim hats (4-inch brim around the entire circumference of the hat) are also recommended for sun protection.  - Call for new or changing lesions.  LENTIGINES, SEBORRHEIC KERATOSES, HEMANGIOMAS - Benign normal skin lesions - Benign-appearing - Call for any changes  MELANOCYTIC NEVI - Tan-brown and/or pink-flesh-colored symmetric macules and papules -  Benign appearing on exam today - Observation - Call clinic for new or changing moles - Recommend daily use of broad spectrum spf 30+ sunscreen to sun-exposed areas.   Acrochordons (Skin Tags) - Fleshy, skin-colored pedunculated papules - Benign appearing.  - Observe. - If desired, they can be removed with an in office procedure that is not covered by insurance. - Please call the clinic if you notice any new or changing lesions.   DERMATOFIBROMA Exam: Firm pink/brown papulenodule with dimple sign. At left scapula  Treatment Plan: A dermatofibroma is a benign growth possibly related to trauma, such as an insect bite, cut from shaving, or inflamed acne-type bump.  Treatment options to remove include shave or excision with resulting scar and risk of recurrence.  Since benign-appearing and not bothersome, will observe for now.   KERATOSIS PILARIS - Tiny follicular keratotic papules - Benign. Genetic in nature. No cure. - Observe. - If desired, patient can use an emollient (moisturizer) containing ammonium lactate (AmLactin), urea or salicylic acid once a day to smooth the area  Recommend starting moisturizer with exfoliant (Urea, Salicylic acid, or Lactic acid) one to two times daily to help smooth rough and bumpy skin.  OTC options include Cetaphil Rough and Bumpy lotion (Urea), Eucerin Roughness Relief lotion or spot treatment cream (Urea), CeraVe SA lotion/cream for Rough and Bumpy skin (Sal Acid), Gold Bond Rough and Bumpy cream (Sal Acid), and AmLactin 12% lotion/cream (Lactic Acid).  If applying in morning, also apply sunscreen to sun-exposed areas, since these exfoliating moisturizers can increase sensitivity to sun.   Varicose Veins/Spider Veins - Dilated blue, purple or red veins at the lower extremities - Reassured - Smaller vessels can be  treated by sclerotherapy (a procedure to inject a medicine into the veins to make them disappear) if desired, but the treatment is not covered by  insurance. Larger vessels may be covered if symptomatic and we would refer to vascular surgeon if treatment desired.   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  Call if any new or changing lesions are noted between office visits   Return in about 1 year (around 07/09/2025) for TBSE.  IEleanor Blush, CMA, am acting as scribe for Alm Rhyme, MD.   Documentation: I have reviewed the above documentation for accuracy and completeness, and I agree with the above.  Alm Rhyme, MD

## 2024-07-10 ENCOUNTER — Encounter: Payer: Self-pay | Admitting: Dermatology

## 2025-07-09 ENCOUNTER — Ambulatory Visit: Admitting: Dermatology
